# Patient Record
Sex: Female | Born: 1945
Health system: Southern US, Community
[De-identification: ages and names within clinical notes are randomized; demographics above are authoritative.]

## PROBLEM LIST (undated history)

## (undated) DIAGNOSIS — K589 Irritable bowel syndrome without diarrhea: Secondary | ICD-10-CM

## (undated) DIAGNOSIS — F419 Anxiety disorder, unspecified: Secondary | ICD-10-CM

## (undated) DIAGNOSIS — E039 Hypothyroidism, unspecified: Secondary | ICD-10-CM

## (undated) DIAGNOSIS — N6019 Diffuse cystic mastopathy of unspecified breast: Secondary | ICD-10-CM

## (undated) DIAGNOSIS — E7800 Pure hypercholesterolemia, unspecified: Secondary | ICD-10-CM

## (undated) DIAGNOSIS — M1990 Unspecified osteoarthritis, unspecified site: Secondary | ICD-10-CM

## (undated) DIAGNOSIS — M810 Age-related osteoporosis without current pathological fracture: Secondary | ICD-10-CM

## (undated) DIAGNOSIS — K219 Gastro-esophageal reflux disease without esophagitis: Secondary | ICD-10-CM

## (undated) HISTORY — DX: Hypothyroidism, unspecified: E03.9

## (undated) HISTORY — DX: Gastro-esophageal reflux disease without esophagitis: K21.9

## (undated) HISTORY — PX: LESION EXCISION: SHX5167

## (undated) HISTORY — DX: Diffuse cystic mastopathy of unspecified breast: N60.19

## (undated) HISTORY — DX: Pure hypercholesterolemia, unspecified: E78.00

## (undated) HISTORY — PX: PARTIAL HYSTERECTOMY: SHX80

## (undated) HISTORY — DX: Unspecified osteoarthritis, unspecified site: M19.90

## (undated) HISTORY — DX: Irritable bowel syndrome, unspecified: K58.9

---

## 1999-04-19 ENCOUNTER — Other Ambulatory Visit: Admission: RE | Admit: 1999-04-19 | Discharge: 1999-04-19 | Payer: Self-pay | Admitting: *Deleted

## 2000-10-09 ENCOUNTER — Other Ambulatory Visit: Admission: RE | Admit: 2000-10-09 | Discharge: 2000-10-09 | Payer: Self-pay | Admitting: *Deleted

## 2007-09-03 ENCOUNTER — Encounter: Admission: RE | Admit: 2007-09-03 | Discharge: 2007-09-03 | Payer: Self-pay | Admitting: Unknown Physician Specialty

## 2011-03-15 ENCOUNTER — Other Ambulatory Visit: Payer: Self-pay | Admitting: Obstetrics and Gynecology

## 2011-03-27 ENCOUNTER — Encounter (HOSPITAL_COMMUNITY): Payer: Self-pay | Admitting: Pharmacist

## 2011-04-02 ENCOUNTER — Encounter (HOSPITAL_COMMUNITY)
Admission: RE | Admit: 2011-04-02 | Discharge: 2011-04-02 | Disposition: A | Discharge status: Home / Self Care | Payer: Medicare Other | Source: Ambulatory Visit | Attending: Obstetrics and Gynecology | Admitting: Obstetrics and Gynecology

## 2011-04-02 ENCOUNTER — Other Ambulatory Visit: Payer: Self-pay

## 2011-04-02 ENCOUNTER — Encounter (HOSPITAL_COMMUNITY): Payer: Self-pay

## 2011-04-02 HISTORY — DX: Anxiety disorder, unspecified: F41.9

## 2011-04-02 LAB — CBC
MCH: 27.5 pg (ref 26.0–34.0)
MCHC: 33.2 g/dL (ref 30.0–36.0)
Platelets: 215 10*3/uL (ref 150–400)
RBC: 4.88 MIL/uL (ref 3.87–5.11)

## 2011-04-02 NOTE — Patient Instructions (Addendum)
20 Katie Dickerson  04/02/2011   Your procedure is scheduled on:  04/04/11  Enter through the Main Entrance of [facility] at 8 AM.  Pick up the phone at the desk and dial [phone].   Call this number if you have problems the morning of surgery: [phone]   Remember:   Do not eat food:After Midnight.  Do not drink clear liquids: After Midnight.  Take these medicines the morning of surgery with A SIP OF WATER: NA   Do not wear jewelry, make-up or nail polish.  Do not wear lotions, powders, or perfumes. You may wear deodorant.  Do not shave 48 hours prior to surgery.  Do not bring valuables to the hospital.  Contacts, dentures or bridgework may not be worn into surgery.  Leave suitcase in the car. After surgery it may be brought to your room.  For patients admitted to the hospital, checkout time is 11:00 AM the day of discharge.   Patients discharged the day of surgery will not be allowed to drive home.  Name and phone number of your driver: NA  Special Instructions: CHG Shower Use Special Wash: 1/2 bottle night before surgery and 1/2 bottle morning of surgery.   Please read over the following fact sheets that you were given: MRSA Information

## 2011-04-03 NOTE — H&P (Signed)
Subjective:    Patient is a 66 y.o. female scheduled for anterior and posterior colporrhaphy. She presented in October 2012 complaining of a large ball protruding through her vagina. On examination at that time she was noted to have a grade 3 cystocele which was the mass she has noted. Because of the symptoms of pelvic pressure and pain this large pelvic floor defected was causing she expressed a desire to undergo surgical repair of these defects. She is now being brought to the hospital to undergo anterior and posterior colporrhaphy. The patient had previously had a hysterectomy. She does note pelvic pressure but denies symptoms of stress incontinence. Pap smear of 03/01/2010 was negative except for atrophic changes.  Review of Systems Cardiovascular: negative  GI no nausea, vomiting or diarrhea GU: there is urgency but no dysuria, frequency or hematuria.. Gyn: No vaginal bleeding discharge or itch   Objective:    There were no vitals taken for this visit.  General:   alert, cooperative and appears stated age  Skin:   normal and no rash or abnormalities  HEENT:  PERRLA  Lungs:   clear to auscultation bilaterally  Heart:   regular rate and rhythm, S1, S2 normal, no murmur, click, rub or gallop  Breasts:   self-exam is taught and encouraged  Abdomen:  soft, non-tender; bowel sounds normal; no masses,  no organomegaly  Pelvis:  External genitalia: normal general appearance BUS: within normal limits Vagina: there is a grade 3 cystocele and 2nd degree rectocele Cervix and uterus are absent Adnexa reveal no masses                                 Assessment:    Symptomatic cystocele, rectocele S/P hysterectomy.  Plan:    Counseling: Procedure, risks, reasons, benefits and complications (including injury to bowel, bladder, major blood vessel, ureter, bleeding, possibility of transfusion, infection, or fistula formation) reviewed in detail. Consent signed. Preop testing  ordered. Instructions reviewed, including NPO after midnight. Plan is to proceed with and anterior and posterior colporrhaphy.

## 2011-04-04 ENCOUNTER — Encounter (HOSPITAL_COMMUNITY): Payer: Self-pay | Admitting: Anesthesiology

## 2011-04-04 ENCOUNTER — Ambulatory Visit (HOSPITAL_COMMUNITY)
Admission: RE | Admit: 2011-04-04 | Discharge: 2011-04-05 | Disposition: A | Discharge status: Home / Self Care | Payer: Medicare Other | Source: Ambulatory Visit | Attending: Obstetrics and Gynecology | Admitting: Obstetrics and Gynecology

## 2011-04-04 ENCOUNTER — Ambulatory Visit (HOSPITAL_COMMUNITY): Payer: Medicare Other | Admitting: Anesthesiology

## 2011-04-04 ENCOUNTER — Encounter (HOSPITAL_COMMUNITY)
Admission: RE | Disposition: A | Discharge status: Home / Self Care | Payer: Self-pay | Source: Ambulatory Visit | Attending: Obstetrics and Gynecology

## 2011-04-04 DIAGNOSIS — Z01818 Encounter for other preprocedural examination: Secondary | ICD-10-CM | POA: Insufficient documentation

## 2011-04-04 DIAGNOSIS — N993 Prolapse of vaginal vault after hysterectomy: Secondary | ICD-10-CM | POA: Insufficient documentation

## 2011-04-04 DIAGNOSIS — Z01812 Encounter for preprocedural laboratory examination: Secondary | ICD-10-CM | POA: Insufficient documentation

## 2011-04-04 HISTORY — PX: ANTERIOR AND POSTERIOR REPAIR: SHX5121

## 2011-04-04 LAB — COMPREHENSIVE METABOLIC PANEL
AST: 17 U/L (ref 0–37)
Albumin: 3.9 g/dL (ref 3.5–5.2)
Calcium: 9.9 mg/dL (ref 8.4–10.5)
Creatinine, Ser: 0.63 mg/dL (ref 0.50–1.10)
Sodium: 137 mEq/L (ref 135–145)

## 2011-04-04 LAB — APTT: aPTT: 28 seconds (ref 24–37)

## 2011-04-04 LAB — PROTIME-INR: INR: 0.93 (ref 0.00–1.49)

## 2011-04-04 SURGERY — ANTERIOR (CYSTOCELE) AND POSTERIOR REPAIR (RECTOCELE)
Anesthesia: General | Site: Vagina | Wound class: Clean Contaminated

## 2011-04-04 MED ORDER — LIDOCAINE-EPINEPHRINE 1 %-1:100000 IJ SOLN
INTRAMUSCULAR | Status: DC | PRN
  Administered 2011-04-04: 6 mL

## 2011-04-04 MED ORDER — DIPHENHYDRAMINE HCL 12.5 MG/5ML PO ELIX
12.5000 mg | ORAL_SOLUTION | Freq: Four times a day (QID) | ORAL | Status: DC | PRN
  Filled 2011-04-04: qty 5

## 2011-04-04 MED ORDER — FENTANYL CITRATE 0.05 MG/ML IJ SOLN
INTRAMUSCULAR | Status: AC
  Filled 2011-04-04: qty 2

## 2011-04-04 MED ORDER — ONDANSETRON HCL 4 MG/2ML IJ SOLN: 4.0000 mg | Freq: Four times a day (QID) | INTRAMUSCULAR | Status: DC | PRN

## 2011-04-04 MED ORDER — PROMETHAZINE HCL 25 MG/ML IJ SOLN: 12.5000 mg | INTRAMUSCULAR | Status: DC | PRN

## 2011-04-04 MED ORDER — OXYCODONE-ACETAMINOPHEN 5-325 MG PO TABS: 1.0000 | ORAL_TABLET | ORAL | Status: DC | PRN

## 2011-04-04 MED ORDER — PROPOFOL 10 MG/ML IV EMUL
INTRAVENOUS | Status: AC
  Filled 2011-04-04: qty 20

## 2011-04-04 MED ORDER — GLYCOPYRROLATE 0.2 MG/ML IJ SOLN
INTRAMUSCULAR | Status: AC
  Filled 2011-04-04: qty 1

## 2011-04-04 MED ORDER — HYDROMORPHONE HCL PF 1 MG/ML IJ SOLN
INTRAMUSCULAR | Status: AC
  Filled 2011-04-04: qty 1

## 2011-04-04 MED ORDER — HYDROMORPHONE HCL PF 1 MG/ML IJ SOLN
0.2500 mg | INTRAMUSCULAR | Status: DC | PRN
  Administered 2011-04-04 (×2): 0.5 mg via INTRAVENOUS

## 2011-04-04 MED ORDER — SODIUM CHLORIDE 0.9 % IJ SOLN: 9.0000 mL | INTRAMUSCULAR | Status: DC | PRN

## 2011-04-04 MED ORDER — FENTANYL CITRATE 0.05 MG/ML IJ SOLN
INTRAMUSCULAR | Status: DC | PRN
  Administered 2011-04-04: 50 ug via INTRAVENOUS
  Administered 2011-04-04 (×2): 25 ug via INTRAVENOUS

## 2011-04-04 MED ORDER — CEFAZOLIN SODIUM 1-5 GM-% IV SOLN
INTRAVENOUS | Status: AC
  Administered 2011-04-04: 1 g via INTRAVENOUS
  Filled 2011-04-04: qty 50

## 2011-04-04 MED ORDER — MIDAZOLAM HCL 2 MG/2ML IJ SOLN
INTRAMUSCULAR | Status: AC
  Filled 2011-04-04: qty 2

## 2011-04-04 MED ORDER — LACTATED RINGERS IV SOLN
INTRAVENOUS | Status: DC
  Administered 2011-04-04: 09:00:00 via INTRAVENOUS

## 2011-04-04 MED ORDER — MIDAZOLAM HCL 5 MG/5ML IJ SOLN
INTRAMUSCULAR | Status: DC | PRN
  Administered 2011-04-04: 1 mg via INTRAVENOUS

## 2011-04-04 MED ORDER — PROPOFOL 10 MG/ML IV EMUL
INTRAVENOUS | Status: DC | PRN
  Administered 2011-04-04: 150 mg via INTRAVENOUS

## 2011-04-04 MED ORDER — KETOROLAC TROMETHAMINE 30 MG/ML IJ SOLN
INTRAMUSCULAR | Status: DC | PRN
  Administered 2011-04-04: 30 mg via INTRAVENOUS

## 2011-04-04 MED ORDER — LIDOCAINE HCL (CARDIAC) 20 MG/ML IV SOLN
INTRAVENOUS | Status: AC
  Filled 2011-04-04: qty 5

## 2011-04-04 MED ORDER — MORPHINE SULFATE (PF) 1 MG/ML IV SOLN
INTRAVENOUS | Status: DC
  Administered 2011-04-04: 13:00:00 via INTRAVENOUS
  Administered 2011-04-04: 3 mg via INTRAVENOUS
  Administered 2011-04-04: 1 mg via INTRAVENOUS
  Administered 2011-04-04: 3 mg via INTRAVENOUS
  Filled 2011-04-04: qty 25

## 2011-04-04 MED ORDER — ONDANSETRON HCL 4 MG/2ML IJ SOLN
INTRAMUSCULAR | Status: AC
  Filled 2011-04-04: qty 2

## 2011-04-04 MED ORDER — MEPERIDINE HCL 25 MG/ML IJ SOLN: 6.2500 mg | INTRAMUSCULAR | Status: DC | PRN

## 2011-04-04 MED ORDER — MENTHOL 3 MG MT LOZG: 1.0000 | LOZENGE | OROMUCOSAL | Status: DC | PRN

## 2011-04-04 MED ORDER — LIDOCAINE HCL (CARDIAC) 20 MG/ML IV SOLN
INTRAVENOUS | Status: DC | PRN
  Administered 2011-04-04: 50 mg via INTRAVENOUS

## 2011-04-04 MED ORDER — ONDANSETRON HCL 4 MG/2ML IJ SOLN
INTRAMUSCULAR | Status: DC | PRN
  Administered 2011-04-04: 4 mg via INTRAVENOUS

## 2011-04-04 MED ORDER — KETOROLAC TROMETHAMINE 30 MG/ML IJ SOLN
INTRAMUSCULAR | Status: AC
  Filled 2011-04-04: qty 1

## 2011-04-04 MED ORDER — GLYCOPYRROLATE 0.2 MG/ML IJ SOLN
INTRAMUSCULAR | Status: DC | PRN
  Administered 2011-04-04: 0.2 mg via INTRAVENOUS

## 2011-04-04 MED ORDER — DIPHENHYDRAMINE HCL 50 MG/ML IJ SOLN: 12.5000 mg | Freq: Four times a day (QID) | INTRAMUSCULAR | Status: DC | PRN

## 2011-04-04 MED ORDER — PROMETHAZINE HCL 25 MG/ML IJ SOLN: 6.2500 mg | INTRAMUSCULAR | Status: DC | PRN

## 2011-04-04 MED ORDER — KETOROLAC TROMETHAMINE 30 MG/ML IJ SOLN: 15.0000 mg | Freq: Once | INTRAMUSCULAR | Status: DC | PRN

## 2011-04-04 MED ORDER — LACTATED RINGERS IV SOLN
INTRAVENOUS | Status: DC
  Administered 2011-04-04 (×2): via INTRAVENOUS

## 2011-04-04 MED ORDER — NALOXONE HCL 0.4 MG/ML IJ SOLN: 0.4000 mg | INTRAMUSCULAR | Status: DC | PRN

## 2011-04-04 MED ORDER — IBUPROFEN 600 MG PO TABS
600.0000 mg | ORAL_TABLET | Freq: Four times a day (QID) | ORAL | Status: DC | PRN
  Administered 2011-04-04: 600 mg via ORAL
  Filled 2011-04-04: qty 1

## 2011-04-04 SURGICAL SUPPLY — 24 items
BLADE SURG 15 STRL LF C SS BP (BLADE) ×1 IMPLANT
BLADE SURG 15 STRL SS (BLADE) ×1
CANISTER SUCTION 2500CC (MISCELLANEOUS) ×2 IMPLANT
CATH BONANNO SUPRAPUBIC 14G (CATHETERS) IMPLANT
CLOTH BEACON ORANGE TIMEOUT ST (SAFETY) ×2 IMPLANT
DECANTER SPIKE VIAL GLASS SM (MISCELLANEOUS) ×2 IMPLANT
GAUZE PACKING IODOFORM 1 (PACKING) ×2 IMPLANT
GLOVE BIO SURGEON STRL SZ7.5 (GLOVE) ×4 IMPLANT
GLOVE BIOGEL PI IND STRL 6 (GLOVE) ×1 IMPLANT
GLOVE BIOGEL PI INDICATOR 6 (GLOVE) ×1
GOWN PREVENTION PLUS LG XLONG (DISPOSABLE) ×6 IMPLANT
GOWN PREVENTION PLUS XXLARGE (GOWN DISPOSABLE) ×2 IMPLANT
NEEDLE HYPO 22GX1.5 SAFETY (NEEDLE) ×2 IMPLANT
NS IRRIG 1000ML POUR BTL (IV SOLUTION) ×2 IMPLANT
PACK VAGINAL WOMENS (CUSTOM PROCEDURE TRAY) ×2 IMPLANT
SUT VIC AB 0 CT1 18XCR BRD8 (SUTURE) IMPLANT
SUT VIC AB 0 CT1 8-18 (SUTURE)
SUT VIC AB 2-0 CT1 27 (SUTURE) ×4
SUT VIC AB 2-0 CT1 TAPERPNT 27 (SUTURE) ×4 IMPLANT
SUT VIC AB 2-0 SH 27 (SUTURE)
SUT VIC AB 2-0 SH 27XBRD (SUTURE) IMPLANT
TOWEL OR 17X24 6PK STRL BLUE (TOWEL DISPOSABLE) ×4 IMPLANT
TRAY FOLEY CATH 14FR (SET/KITS/TRAYS/PACK) ×2 IMPLANT
WATER STERILE IRR 1000ML POUR (IV SOLUTION) ×2 IMPLANT

## 2011-04-04 NOTE — Anesthesia Preprocedure Evaluation (Signed)
Anesthesia Evaluation  Patient identified by MRN, date of birth, ID band Patient awake    Reviewed: Allergy & Precautions, H&P , NPO status , Patient's Chart, lab work & pertinent test results  Airway Mallampati: I TM Distance: >3 FB Neck ROM: full    Dental No notable dental hx. (+) Teeth Intact   Pulmonary neg pulmonary ROS,    Pulmonary exam normal       Cardiovascular neg cardio ROS     Neuro/Psych PSYCHIATRIC DISORDERS Anxiety Negative Neurological ROS     GI/Hepatic negative GI ROS, Neg liver ROS,   Endo/Other  Negative Endocrine ROS  Renal/GU negative Renal ROS  Genitourinary negative   Musculoskeletal negative musculoskeletal ROS (+)   Abdominal Normal abdominal exam  (+)   Peds negative pediatric ROS (+)  Hematology negative hematology ROS (+)   Anesthesia Other Findings   Reproductive/Obstetrics negative OB ROS                           Anesthesia Physical Anesthesia Plan  ASA: II  Anesthesia Plan: General   Post-op Pain Management:    Induction: Intravenous  Airway Management Planned: LMA  Additional Equipment:   Intra-op Plan:   Post-operative Plan:   Informed Consent: I have reviewed the patients History and Physical, chart, labs and discussed the procedure including the risks, benefits and alternatives for the proposed anesthesia with the patient or authorized representative who has indicated his/her understanding and acceptance.     Plan Discussed with: CRNA  Anesthesia Plan Comments:         Anesthesia Quick Evaluation

## 2011-04-04 NOTE — Transfer of Care (Signed)
Immediate Anesthesia Transfer of Care Note  Patient: Katie Dickerson  Procedure(s) Performed: Procedure(s) (LRB): ANTERIOR (CYSTOCELE) AND POSTERIOR REPAIR (RECTOCELE) (N/A)  Patient Location: PACU  Anesthesia Type: General  Level of Consciousness: awake, alert  and sedated  Airway & Oxygen Therapy: Patient Spontanous Breathing and Patient connected to nasal cannula oxygen  Post-op Assessment: Report given to PACU RN and Post -op Vital signs reviewed and stable  Post vital signs: Reviewed and stable  Complications: No apparent anesthesia complications

## 2011-04-04 NOTE — Anesthesia Postprocedure Evaluation (Signed)
Anesthesia Post Note  Patient: Katie Dickerson  Procedure(s) Performed: Procedure(s) (LRB): ANTERIOR (CYSTOCELE) AND POSTERIOR REPAIR (RECTOCELE) (N/A)  Anesthesia type: General  Patient location: PACU  Post pain: Pain level controlled  Post assessment: Post-op Vital signs reviewed  Last Vitals:  Filed Vitals:   04/04/11 1100  BP: 120/67  Pulse: 60  Temp:   Resp: 21    Post vital signs: Reviewed  Level of consciousness: sedated  Complications: No apparent anesthesia complicationsfj

## 2011-04-04 NOTE — Brief Op Note (Signed)
04/04/2011  10:30 AM  PATIENT:  Katie Dickerson  66 y.o. female  PRE-OPERATIVE DIAGNOSIS:  CYSTOCELE RECTOCELE  POST-OPERATIVE DIAGNOSIS:  CYSTOCELE RECTOCELE  PROCEDURE:  Procedure(s) (LRB): ANTERIOR (CYSTOCELE) AND POSTERIOR REPAIR (RECTOCELE) (N/A)  SURGEON:  Surgeon(s) and Role:    * Miguel Aschoff, MD - Primary    * W Lodema Hong, MD - Assisting  ANESTHESIA:   general  EBL:  Total I/O In: 1200 [I.V.:1200] Out: 120 [Urine:100; Blood:20]  BLOOD ADMINISTERED:none  DRAINS: Vaginal pack in place   LOCAL MEDICATIONS USED:  XYLOCAINE   SPECIMEN:  No Specimen  DISPOSITION OF SPECIMEN:  N/A  COUNTS:  YES   DICTATION: .Other Dictation: Dictation Number [id number]  PLAN OF CARE: Admit for overnight observation  PATIENT DISPOSITION:  PACU - hemodynamically stable.   Delay start of Pharmacological VTE agent (>24hrs) due to surgical blood loss or risk of bleeding: Has PAS hose

## 2011-04-04 NOTE — Progress Notes (Signed)
UR chart review completed.  

## 2011-04-05 NOTE — Discharge Instructions (Addendum)
Set up follow up visit for 4 weeks  Take pain medication as needed  Nothing per vagina for 4 weeks  Call for any problems such as fever pain or heavy bleedng  Resume prior medications

## 2011-04-05 NOTE — Op Note (Signed)
NAMEMELESSIA, KAUS              ACCOUNT NO.:  [id number]  MEDICAL RECORD NO.:  [id number]  LOCATION:  9304                          FACILITY:  [facility]  PHYSICIAN:  Miguel Aschoff, M.D.       DATE OF BIRTH:  04-21-1945  DATE OF PROCEDURE:  04/04/2011 DATE OF DISCHARGE:                              OPERATIVE REPORT   PREOPERATIVE DIAGNOSIS:  Large symptomatic cystocele, rectocele.  PROCEDURE:  Anterior and posterior colporrhaphy.  SURGEON:  Miguel Aschoff, M.D.  ASSISTANT:  Luvenia Redden, M.D.  ANESTHESIA:  General.  COMPLICATIONS:  None.  JUSTIFICATION:  The patient is a 66 year old, white female, who presented to the office, reporting a very large bulge protruding through the vagina.  On examination, the patient had a grade 3 cystocele and a first and second-degree rectocele.  The major portion of the patient's defect was anterior vaginal wall with large cystocele.  Because of symptoms associated with this, the patient requested that the procedure be carried out to correct the pelvic floor defects, and she was given informed consent for anterior-posterior colporrhaphy.  The risks and benefits of the procedure were discussed with the patient.  Informed consent has been obtained.  PROCEDURE IN DETAIL:  The patient was taken to the operating room, placed in supine position.  General anesthesia was administered without difficulty.  She was then placed in the dorsal lithotomy position and prepped and draped in usual sterile fashion.  Foley catheter was inserted.  Following this, the anterior vaginal wall was injected with 1% Xylocaine with epinephrine for hemostasis.  Total of 6 mL were used. At this point, the midline of the anterior vaginal wall was elevated with Allis clamps.  The large cystocele identified and then the vaginal mucosa was incised in the midline from within 2 cm of the urethral orifice to the apex of the vaginal cuff.  After this was done, the cystocele and  paravesical fascia were removed from the overlying vaginal mucosa.  This was done without difficulty and with care to avoid any injury to the bladder.  Once this was done, and the large cystocele identified, the cystocele was reduced using several purse-string sutures of 2-0 Vicryl.  After the cystocele was reduced, the paravesical fascia was reapproximated in the midline using interrupted 2-0 Vicryl sutures. At this point, the excess vaginal mucosa was trimmed and then the mucosa was reapproximated using running interlocking 2-0 Vicryl suture, incorporating the paravesical fascia to close the dead space.  After this was done and completed the repair of the anterior vaginal wall, attention was directed to the posterior vaginal wall.  The lips of perineal skin was elevated.  Allis clamps were then placed in the midline of the posterior vaginal wall for about 5 cm, posterior vaginal wall was then injected with 1% Xylocaine with epinephrine for hemostasis and then the dissection was carried out in the midline.  Once this was done, the pararectal fascia and rectocele were dissected free from the overlying vaginal mucosa.  At this point, the rectocele was clearly outlined and then the rectocele was reduced using 2 purse-string sutures of 2-0 Vicryl suture and then reinforced using interrupted 2-0 Vicryl sutures using the pararectal  fascia.  The excess vaginal mucosa was then trimmed and reapproximated in the midline using running interlocking 2-0 Vicryl suture again closing the dead space.  This was continued until the perineal body was reached.  Two Crown sutures of 0 Vicryl were used to reconstitute the perineal body.  Once this was done, the perineum was closed using a subcutaneous suture of 2-0 Vicryl followed by a subcuticular suture of 2-0 Vicryl.  At this point, the procedure was completed and Iodoform pack was placed in the vagina for compression of hemostasis.  Estimated blood loss was  minimal.  The patient reversed from the anesthetic and taken to recovery room in satisfactory condition.  The estimated blood loss again was minimal.  Plan for the patient to be observed overnight and to be discharged home on April 05, 2011, in stable and satisfactory condition.     Miguel Aschoff, M.D.     AR/MEDQ  D:  04/04/2011  T:  04/05/2011  Job:  [id number]

## 2011-04-05 NOTE — Anesthesia Postprocedure Evaluation (Signed)
Anesthesia Post Note  Patient: Katie Dickerson  Procedure(s) Performed: Procedure(s) (LRB): ANTERIOR (CYSTOCELE) AND POSTERIOR REPAIR (RECTOCELE) (N/A)  Anesthesia type: General  Patient location: Mother/Baby  Post pain: Pain level controlled  Post assessment: Post-op Vital signs reviewed  Last Vitals:  Filed Vitals:   04/05/11 0545  BP: 118/70  Pulse: 78  Temp: 36.8 C  Resp: 18    Post vital signs: Reviewed  Level of consciousness: awake and alert   Complications: No apparent anesthesia complications

## 2011-04-05 NOTE — Progress Notes (Signed)
S: Doing well this AM. Has used minimal pain medication. Has not voided yet.  O: Afebrile V/S stable      Abdomen is soft non tender      Perineum is dry  A: Stable s/p A&P repair.  P: Discharge home is she can not void will send home with leg bag      RTC in 4 weeks      Meds Tylox one ever 3 to 4 hours as needed for pain      Nothing per vagina       Regular diet      Resume prior meds      To call for fever, severe pain or heavy bleeding.

## 2011-04-05 NOTE — Addendum Note (Signed)
Addendum  created 04/05/11 0746 by Doreene Burke, CRNA   Modules edited:Notes Section

## 2011-04-08 ENCOUNTER — Encounter (HOSPITAL_COMMUNITY): Payer: Self-pay | Admitting: Obstetrics and Gynecology

## 2011-04-09 NOTE — Discharge Summary (Signed)
NAMEPAULETTE, ROCKFORD              ACCOUNT NO.:  [id number]  MEDICAL RECORD NO.:  [id number]  LOCATION:  9304                          FACILITY:  [facility]  PHYSICIAN:  Miguel Aschoff, M.D.       DATE OF BIRTH:  [birth year]  DATE OF ADMISSION:  04/04/2011 DATE OF DISCHARGE:  04/05/2011                              DISCHARGE SUMMARY   ADMISSION DIAGNOSIS:  Symptomatic cystocele and rectocele.  OPERATIONS AND PROCEDURES:  Anterior and posterior colporrhaphy, general anesthesia.  BRIEF HISTORY:  The patient is a 66 year old white female, status post prior hysterectomy, who reported that she had noticed a large bulge protruding through the vagina.  On examination, this large bulge hole was a second to third-degree cystocele with mild urethrocele.  The patient denied any symptoms of urinary stress incontinence.  In addition, she had a first to second-degree rectocele similarly noted on this examination.  Because of symptomatology associated with these pelvic floor defects, the patient has requested that a procedure to be carried out in effort to repair these pelvic floor defects, and she was brought to the hospital to undergo anterior and posterior colporrhaphy after being counseled as to risks and benefits of this procedure.  HOSPITAL COURSE:  Preoperative studies were obtained.  This revealed admission hemoglobin of 13.4, hematocrit 40.4, white count was 7400. Chemistry profile was essentially within normal limits except for a glucose of 108.  PT and PTT were within normal limits.  HOSPITAL COURSE:  Under general anesthesia on April 04, 2011, the patient underwent an anterior and posterior colporrhaphy without difficulty with repair of the large cystocele and smaller rectocele. The patient's postoperative course was essentially uncomplicated.  By the morning of the first postoperative day, she was tolerating regular diet, ambulating well.  She had a vaginal pack removed with only  small amount of drainage and was able to void spontaneously without significant residual urine.  Because of this, it was felt that she was stable enough to be discharged home.  Medications for home included Tylox 1 every 3-4 hours as needed for pain, requested to do no heavy lifting, place nothing in the vagina, to call for any problems such as fever, pain, or heavy bleeding.  She will be seen back in 4 weeks for followup examination.  She was sent home on a regular diet in satisfactory condition.     Miguel Aschoff, M.D.     AR/MEDQ  D:  04/08/2011  T:  04/09/2011  Job:  [id number]

## 2015-02-22 DIAGNOSIS — G894 Chronic pain syndrome: Secondary | ICD-10-CM | POA: Diagnosis not present

## 2015-02-22 DIAGNOSIS — M5136 Other intervertebral disc degeneration, lumbar region: Secondary | ICD-10-CM | POA: Diagnosis not present

## 2015-02-22 DIAGNOSIS — M4854XG Collapsed vertebra, not elsewhere classified, thoracic region, subsequent encounter for fracture with delayed healing: Secondary | ICD-10-CM | POA: Diagnosis not present

## 2015-02-22 DIAGNOSIS — M546 Pain in thoracic spine: Secondary | ICD-10-CM | POA: Diagnosis not present

## 2015-03-08 DIAGNOSIS — M1990 Unspecified osteoarthritis, unspecified site: Secondary | ICD-10-CM | POA: Diagnosis not present

## 2015-03-08 DIAGNOSIS — J45909 Unspecified asthma, uncomplicated: Secondary | ICD-10-CM | POA: Diagnosis not present

## 2015-03-08 DIAGNOSIS — R5383 Other fatigue: Secondary | ICD-10-CM | POA: Diagnosis not present

## 2015-03-08 DIAGNOSIS — F419 Anxiety disorder, unspecified: Secondary | ICD-10-CM | POA: Diagnosis not present

## 2015-03-08 DIAGNOSIS — R9431 Abnormal electrocardiogram [ECG] [EKG]: Secondary | ICD-10-CM | POA: Diagnosis not present

## 2015-03-23 ENCOUNTER — Institutional Professional Consult (permissible substitution): Payer: Self-pay | Admitting: Internal Medicine

## 2015-03-28 DIAGNOSIS — H524 Presbyopia: Secondary | ICD-10-CM | POA: Diagnosis not present

## 2015-03-28 DIAGNOSIS — H2513 Age-related nuclear cataract, bilateral: Secondary | ICD-10-CM | POA: Diagnosis not present

## 2015-03-31 ENCOUNTER — Encounter: Payer: Self-pay | Admitting: Internal Medicine

## 2015-03-31 ENCOUNTER — Ambulatory Visit (INDEPENDENT_AMBULATORY_CARE_PROVIDER_SITE_OTHER): Payer: PPO | Admitting: Internal Medicine

## 2015-03-31 VITALS — BP 134/80 | HR 72 | Ht 62.5 in | Wt 149.0 lb

## 2015-03-31 DIAGNOSIS — R0600 Dyspnea, unspecified: Secondary | ICD-10-CM | POA: Diagnosis not present

## 2015-03-31 DIAGNOSIS — R938 Abnormal findings on diagnostic imaging of other specified body structures: Secondary | ICD-10-CM

## 2015-03-31 DIAGNOSIS — R9389 Abnormal findings on diagnostic imaging of other specified body structures: Secondary | ICD-10-CM

## 2015-03-31 MED ORDER — ALBUTEROL SULFATE HFA 108 (90 BASE) MCG/ACT IN AERS: INHALATION_SPRAY | RESPIRATORY_TRACT | Status: DC

## 2015-03-31 NOTE — Patient Instructions (Addendum)
Either take BREO perfectly regularly or leave it off completely   Only use your albuterol (proair) as a rescue medication to be used if you can't catch your breath by resting or doing a relaxed purse lip breathing pattern.  - The less you use it, the better it will work when you need it. - Ok to use up to 2 puffs  every 4 hours if you must but call for immediate appointment if use goes up over your usual need - Don't leave home without it !!  (think of it like the spare tire for your car)    If you are satisfied with your treatment plan,  let your doctor know and he/she can either refill your medications or you can return here when your prescription runs out.     If in any way you are not 100% satisfied,  please tell us.  If 100% better, tell your friends!  Pulmonary follow up is as needed

## 2015-03-31 NOTE — Progress Notes (Signed)
Subjective:     Patient ID: Katie Dickerson, female   DOB: [birth year],    MRN: [id number]  HPI  59 yowf never smoker noted onset of breathing walking up steps carrying clothes x 2015/16 resolved p started BREO per Chodri referred to pulmonary clinic by Dr Feliciana Rossetti.   03/31/2015 1st [facility] office visit/ Keyoni Lapinski   Chief Complaint  Patient presents with  . Pulmonary Consult    Referred by Dr. Feliciana Rossetti for eval of Asthma. Pt states her breathing is doing well today. She states that she gets SOB "maybe with exertion".    .Not limited by breathing from desired activities   no problem sleeping and no longer attempting to walk up steps which was the only activity she says made her sob. Never using saba  No obvious day to day or daytime variability or assoc chronic cough or cp or chest tightness, subjective wheeze or overt sinus or hb symptoms. No unusual exp hx or h/o childhood pna/ asthma or knowledge of premature birth.  Sleeping ok without nocturnal  or early am exacerbation  of respiratory  c/o's or need for noct saba. Also denies any obvious fluctuation of symptoms with weather or environmental changes or other aggravating or alleviating factors except as outlined above   Current Medications, Allergies, Complete Past Medical History, Past Surgical History, Family History, and Social History were reviewed in [employer] record.  ROS  The following are not active complaints unless bolded sore throat, dysphagia, dental problems, itching, sneezing,  nasal congestion or excess/ purulent secretions, ear ache,   fever, chills, sweats, unintended wt loss, classically pleuritic or exertional cp, hemoptysis,  orthopnea pnd or leg swelling, presyncope, palpitations, abdominal pain, anorexia, nausea, vomiting, diarrhea  or change in bowel or bladder habits, change in stools or urine, dysuria,hematuria,  rash, arthralgias, visual complaints, headache, numbness, weakness or  ataxia or problems with walking or coordination,  change in mood/affect or memory.           Review of Systems     Objective:   Physical Exam Amb wf nad very evasive with questions related to symptoms   Wt Readings from Last 3 Encounters:  03/31/15 149 lb (67.586 kg)  04/05/11 145 lb (65.772 kg)  04/02/11 145 lb (65.772 kg)    Vital signs reviewed  HEENT: nl dentition, turbinates, and oropharynx. Nl external ear canals without cough reflex   NECK :  without JVD/Nodes/TM/ nl carotid upstrokes bilaterally   LUNGS: no acc muscle use,  Nl contour chest which is clear to A and P bilaterally without cough on insp or exp maneuvers   CV:  RRR  no s3 or murmur or increase in P2, no edema   ABD:  soft and nontender with nl inspiratory excursion in the supine position. No bruits or organomegaly, bowel sounds nl  MS:  Nl gait/ ext warm without deformities, calf tenderness, cyanosis or clubbing No obvious joint restrictions   SKIN: warm and dry without lesions    NEURO:  alert, approp, nl sensorium with  no motor deficits     I personally reviewed images and agree with radiology impression as follows:  CTa Chest   01/19/15    No evidence of PE/ no infiltrate or effusion. perfissual lympmh node  On R > if pt high risk, repeat w/in one year      Assessment:

## 2015-04-01 ENCOUNTER — Encounter: Payer: Self-pay | Admitting: Internal Medicine

## 2015-04-01 DIAGNOSIS — R9389 Abnormal findings on diagnostic imaging of other specified body structures: Secondary | ICD-10-CM | POA: Insufficient documentation

## 2015-04-01 DIAGNOSIS — R0600 Dyspnea, unspecified: Secondary | ICD-10-CM | POA: Insufficient documentation

## 2015-04-01 NOTE — Assessment & Plan Note (Signed)
See scan 01/19/15 c/w pre fissural Lymph node  In the setting of a never smoker with a typical appearance of a benign lymph node there is no recommendation for regular dedicated CT F/u  Discussed in detail all the  indications, usual  risks and alternatives  relative to the benefits with patient who agrees to proceed with conservative f/u as outlined  =  Repeat ct only if symptoms warrant

## 2015-04-01 NOTE — Assessment & Plan Note (Signed)
The lack of variability/ noct symptoms or assoc cough / wheeze by her hx do not sound like asthma to me and clearly this is not copd in this never smoker though I could not find any pfts in Dr Terrilee Croak notes  In this setting it's hard to commit her to longterm BREO and fine to leave it off and just use prn saba - if breathing worse or saba use over twice weekly then would rechallenge with BREO  I had an extended discussion with the patient reviewing all relevant studies completed to date and  lasting 35  minutes of a 60 minute visit    Each maintenance medication was reviewed in detail including most importantly the difference between maintenance and prns and under what circumstances the prns are to be triggered using an action plan format that is not reflected in the computer generated alphabetically organized AVS.    Please see instructions for details which were reviewed in writing and the patient given a copy highlighting the part that I personally wrote and discussed at today's ov.   Regular pulmonary f/u not needed for this problem

## 2015-05-01 DIAGNOSIS — M810 Age-related osteoporosis without current pathological fracture: Secondary | ICD-10-CM | POA: Diagnosis not present

## 2015-05-01 DIAGNOSIS — K210 Gastro-esophageal reflux disease with esophagitis: Secondary | ICD-10-CM | POA: Diagnosis not present

## 2015-05-01 DIAGNOSIS — R5381 Other malaise: Secondary | ICD-10-CM | POA: Diagnosis not present

## 2015-05-01 DIAGNOSIS — E559 Vitamin D deficiency, unspecified: Secondary | ICD-10-CM | POA: Diagnosis not present

## 2015-05-01 DIAGNOSIS — M150 Primary generalized (osteo)arthritis: Secondary | ICD-10-CM | POA: Diagnosis not present

## 2015-05-01 DIAGNOSIS — F419 Anxiety disorder, unspecified: Secondary | ICD-10-CM | POA: Diagnosis not present

## 2015-05-01 DIAGNOSIS — N6019 Diffuse cystic mastopathy of unspecified breast: Secondary | ICD-10-CM | POA: Diagnosis not present

## 2015-05-01 DIAGNOSIS — Z0000 Encounter for general adult medical examination without abnormal findings: Secondary | ICD-10-CM | POA: Diagnosis not present

## 2015-05-01 DIAGNOSIS — R5383 Other fatigue: Secondary | ICD-10-CM | POA: Diagnosis not present

## 2015-05-01 DIAGNOSIS — E0590 Thyrotoxicosis, unspecified without thyrotoxic crisis or storm: Secondary | ICD-10-CM | POA: Diagnosis not present

## 2015-06-14 DIAGNOSIS — N2889 Other specified disorders of kidney and ureter: Secondary | ICD-10-CM | POA: Diagnosis not present

## 2015-06-14 DIAGNOSIS — Z87440 Personal history of urinary (tract) infections: Secondary | ICD-10-CM | POA: Diagnosis not present

## 2015-07-27 DIAGNOSIS — R5381 Other malaise: Secondary | ICD-10-CM | POA: Diagnosis not present

## 2015-07-27 DIAGNOSIS — M150 Primary generalized (osteo)arthritis: Secondary | ICD-10-CM | POA: Diagnosis not present

## 2015-07-27 DIAGNOSIS — R5383 Other fatigue: Secondary | ICD-10-CM | POA: Diagnosis not present

## 2015-07-27 DIAGNOSIS — F419 Anxiety disorder, unspecified: Secondary | ICD-10-CM | POA: Diagnosis not present

## 2015-07-27 DIAGNOSIS — F331 Major depressive disorder, recurrent, moderate: Secondary | ICD-10-CM | POA: Diagnosis not present

## 2015-07-27 DIAGNOSIS — F5101 Primary insomnia: Secondary | ICD-10-CM | POA: Diagnosis not present

## 2015-07-27 DIAGNOSIS — E559 Vitamin D deficiency, unspecified: Secondary | ICD-10-CM | POA: Diagnosis not present

## 2015-08-09 DIAGNOSIS — M150 Primary generalized (osteo)arthritis: Secondary | ICD-10-CM | POA: Diagnosis not present

## 2015-08-09 DIAGNOSIS — F331 Major depressive disorder, recurrent, moderate: Secondary | ICD-10-CM | POA: Diagnosis not present

## 2015-09-19 DIAGNOSIS — M8000XG Age-related osteoporosis with current pathological fracture, unspecified site, subsequent encounter for fracture with delayed healing: Secondary | ICD-10-CM | POA: Diagnosis not present

## 2015-09-19 DIAGNOSIS — F5101 Primary insomnia: Secondary | ICD-10-CM | POA: Diagnosis not present

## 2015-09-19 DIAGNOSIS — F419 Anxiety disorder, unspecified: Secondary | ICD-10-CM | POA: Diagnosis not present

## 2015-10-18 DIAGNOSIS — Z1231 Encounter for screening mammogram for malignant neoplasm of breast: Secondary | ICD-10-CM | POA: Diagnosis not present

## 2015-10-18 DIAGNOSIS — Z01419 Encounter for gynecological examination (general) (routine) without abnormal findings: Secondary | ICD-10-CM | POA: Diagnosis not present

## 2015-10-26 DIAGNOSIS — Z5181 Encounter for therapeutic drug level monitoring: Secondary | ICD-10-CM | POA: Diagnosis not present

## 2015-10-31 DIAGNOSIS — M5136 Other intervertebral disc degeneration, lumbar region: Secondary | ICD-10-CM | POA: Diagnosis not present

## 2015-11-16 DIAGNOSIS — M5136 Other intervertebral disc degeneration, lumbar region: Secondary | ICD-10-CM | POA: Diagnosis not present

## 2015-11-16 DIAGNOSIS — M546 Pain in thoracic spine: Secondary | ICD-10-CM | POA: Diagnosis not present

## 2015-11-16 DIAGNOSIS — M818 Other osteoporosis without current pathological fracture: Secondary | ICD-10-CM | POA: Diagnosis not present

## 2015-11-16 DIAGNOSIS — G894 Chronic pain syndrome: Secondary | ICD-10-CM | POA: Diagnosis not present

## 2015-11-21 DIAGNOSIS — Z23 Encounter for immunization: Secondary | ICD-10-CM | POA: Diagnosis not present

## 2016-01-19 DIAGNOSIS — M1812 Unilateral primary osteoarthritis of first carpometacarpal joint, left hand: Secondary | ICD-10-CM | POA: Diagnosis not present

## 2016-01-19 DIAGNOSIS — M1811 Unilateral primary osteoarthritis of first carpometacarpal joint, right hand: Secondary | ICD-10-CM | POA: Diagnosis not present

## 2016-01-19 DIAGNOSIS — M180 Bilateral primary osteoarthritis of first carpometacarpal joints: Secondary | ICD-10-CM | POA: Diagnosis not present

## 2016-04-22 DIAGNOSIS — M1811 Unilateral primary osteoarthritis of first carpometacarpal joint, right hand: Secondary | ICD-10-CM | POA: Diagnosis not present

## 2016-04-22 DIAGNOSIS — M1812 Unilateral primary osteoarthritis of first carpometacarpal joint, left hand: Secondary | ICD-10-CM | POA: Diagnosis not present

## 2016-06-07 DIAGNOSIS — G5601 Carpal tunnel syndrome, right upper limb: Secondary | ICD-10-CM | POA: Diagnosis not present

## 2016-06-07 DIAGNOSIS — M1812 Unilateral primary osteoarthritis of first carpometacarpal joint, left hand: Secondary | ICD-10-CM | POA: Diagnosis not present

## 2016-06-07 DIAGNOSIS — M1811 Unilateral primary osteoarthritis of first carpometacarpal joint, right hand: Secondary | ICD-10-CM | POA: Diagnosis not present

## 2016-06-25 DIAGNOSIS — M5136 Other intervertebral disc degeneration, lumbar region: Secondary | ICD-10-CM | POA: Diagnosis not present

## 2016-07-22 DIAGNOSIS — G5601 Carpal tunnel syndrome, right upper limb: Secondary | ICD-10-CM | POA: Diagnosis not present

## 2016-07-25 DIAGNOSIS — F5101 Primary insomnia: Secondary | ICD-10-CM | POA: Diagnosis not present

## 2016-07-25 DIAGNOSIS — F419 Anxiety disorder, unspecified: Secondary | ICD-10-CM | POA: Diagnosis not present

## 2016-07-25 DIAGNOSIS — M8000XG Age-related osteoporosis with current pathological fracture, unspecified site, subsequent encounter for fracture with delayed healing: Secondary | ICD-10-CM | POA: Diagnosis not present

## 2016-07-25 DIAGNOSIS — E559 Vitamin D deficiency, unspecified: Secondary | ICD-10-CM | POA: Diagnosis not present

## 2016-07-25 DIAGNOSIS — E782 Mixed hyperlipidemia: Secondary | ICD-10-CM | POA: Diagnosis not present

## 2016-07-25 DIAGNOSIS — R5383 Other fatigue: Secondary | ICD-10-CM | POA: Diagnosis not present

## 2016-07-25 DIAGNOSIS — R5381 Other malaise: Secondary | ICD-10-CM | POA: Diagnosis not present

## 2016-07-25 DIAGNOSIS — Z79899 Other long term (current) drug therapy: Secondary | ICD-10-CM | POA: Diagnosis not present

## 2016-07-25 DIAGNOSIS — F331 Major depressive disorder, recurrent, moderate: Secondary | ICD-10-CM | POA: Diagnosis not present

## 2016-07-25 DIAGNOSIS — Z5181 Encounter for therapeutic drug level monitoring: Secondary | ICD-10-CM | POA: Diagnosis not present

## 2016-07-25 DIAGNOSIS — K210 Gastro-esophageal reflux disease with esophagitis: Secondary | ICD-10-CM | POA: Diagnosis not present

## 2016-07-25 DIAGNOSIS — N6019 Diffuse cystic mastopathy of unspecified breast: Secondary | ICD-10-CM | POA: Diagnosis not present

## 2016-07-25 DIAGNOSIS — M150 Primary generalized (osteo)arthritis: Secondary | ICD-10-CM | POA: Diagnosis not present

## 2016-07-25 DIAGNOSIS — E0590 Thyrotoxicosis, unspecified without thyrotoxic crisis or storm: Secondary | ICD-10-CM | POA: Diagnosis not present

## 2016-08-09 DIAGNOSIS — R0789 Other chest pain: Secondary | ICD-10-CM | POA: Diagnosis not present

## 2016-08-09 DIAGNOSIS — K219 Gastro-esophageal reflux disease without esophagitis: Secondary | ICD-10-CM | POA: Diagnosis not present

## 2016-08-09 DIAGNOSIS — R079 Chest pain, unspecified: Secondary | ICD-10-CM | POA: Diagnosis not present

## 2016-08-09 DIAGNOSIS — Z79899 Other long term (current) drug therapy: Secondary | ICD-10-CM | POA: Diagnosis not present

## 2016-08-09 DIAGNOSIS — F419 Anxiety disorder, unspecified: Secondary | ICD-10-CM | POA: Diagnosis not present

## 2016-08-10 DIAGNOSIS — R079 Chest pain, unspecified: Secondary | ICD-10-CM | POA: Diagnosis not present

## 2016-08-10 DIAGNOSIS — F419 Anxiety disorder, unspecified: Secondary | ICD-10-CM | POA: Diagnosis not present

## 2016-08-10 DIAGNOSIS — K219 Gastro-esophageal reflux disease without esophagitis: Secondary | ICD-10-CM | POA: Diagnosis not present

## 2016-08-10 DIAGNOSIS — R0789 Other chest pain: Secondary | ICD-10-CM | POA: Diagnosis not present

## 2016-10-16 DIAGNOSIS — H02831 Dermatochalasis of right upper eyelid: Secondary | ICD-10-CM | POA: Diagnosis not present

## 2016-10-16 DIAGNOSIS — H2512 Age-related nuclear cataract, left eye: Secondary | ICD-10-CM | POA: Diagnosis not present

## 2016-10-16 DIAGNOSIS — H2511 Age-related nuclear cataract, right eye: Secondary | ICD-10-CM | POA: Diagnosis not present

## 2016-11-06 DIAGNOSIS — H2512 Age-related nuclear cataract, left eye: Secondary | ICD-10-CM | POA: Diagnosis not present

## 2016-11-19 DIAGNOSIS — Z23 Encounter for immunization: Secondary | ICD-10-CM | POA: Diagnosis not present

## 2016-11-19 DIAGNOSIS — K210 Gastro-esophageal reflux disease with esophagitis: Secondary | ICD-10-CM | POA: Diagnosis not present

## 2016-11-19 DIAGNOSIS — E0590 Thyrotoxicosis, unspecified without thyrotoxic crisis or storm: Secondary | ICD-10-CM | POA: Diagnosis not present

## 2016-11-19 DIAGNOSIS — M150 Primary generalized (osteo)arthritis: Secondary | ICD-10-CM | POA: Diagnosis not present

## 2016-11-19 DIAGNOSIS — M8000XG Age-related osteoporosis with current pathological fracture, unspecified site, subsequent encounter for fracture with delayed healing: Secondary | ICD-10-CM | POA: Diagnosis not present

## 2016-12-06 DIAGNOSIS — M1812 Unilateral primary osteoarthritis of first carpometacarpal joint, left hand: Secondary | ICD-10-CM | POA: Diagnosis not present

## 2016-12-06 DIAGNOSIS — M1811 Unilateral primary osteoarthritis of first carpometacarpal joint, right hand: Secondary | ICD-10-CM | POA: Diagnosis not present

## 2016-12-06 DIAGNOSIS — G5601 Carpal tunnel syndrome, right upper limb: Secondary | ICD-10-CM | POA: Diagnosis not present

## 2017-01-06 DIAGNOSIS — M79642 Pain in left hand: Secondary | ICD-10-CM | POA: Diagnosis not present

## 2017-01-06 DIAGNOSIS — M79641 Pain in right hand: Secondary | ICD-10-CM | POA: Diagnosis not present

## 2017-05-01 DIAGNOSIS — H25811 Combined forms of age-related cataract, right eye: Secondary | ICD-10-CM | POA: Diagnosis not present

## 2017-05-01 DIAGNOSIS — H2511 Age-related nuclear cataract, right eye: Secondary | ICD-10-CM | POA: Diagnosis not present

## 2017-05-01 DIAGNOSIS — H524 Presbyopia: Secondary | ICD-10-CM | POA: Diagnosis not present

## 2017-05-01 DIAGNOSIS — Z01818 Encounter for other preprocedural examination: Secondary | ICD-10-CM | POA: Diagnosis not present

## 2017-05-16 ENCOUNTER — Inpatient Hospital Stay (HOSPITAL_COMMUNITY): Payer: PPO

## 2017-05-16 ENCOUNTER — Encounter (HOSPITAL_COMMUNITY)
Admission: EM | Disposition: A | Discharge status: Skilled Nursing Facility | Payer: Self-pay | Source: Home / Self Care | Attending: Orthopedic Surgery

## 2017-05-16 ENCOUNTER — Encounter (HOSPITAL_COMMUNITY): Payer: Self-pay | Admitting: Internal Medicine

## 2017-05-16 ENCOUNTER — Inpatient Hospital Stay (HOSPITAL_COMMUNITY)
Admission: EM | Admit: 2017-05-16 | Discharge: 2017-05-20 | DRG: 481 | Disposition: A | Discharge status: Skilled Nursing Facility | Payer: PPO | Attending: Orthopedic Surgery | Admitting: Orthopedic Surgery

## 2017-05-16 ENCOUNTER — Inpatient Hospital Stay (HOSPITAL_COMMUNITY): Payer: PPO | Admitting: Anesthesiology

## 2017-05-16 ENCOUNTER — Emergency Department (HOSPITAL_COMMUNITY): Payer: PPO

## 2017-05-16 ENCOUNTER — Other Ambulatory Visit: Payer: Self-pay

## 2017-05-16 DIAGNOSIS — S72009A Fracture of unspecified part of neck of unspecified femur, initial encounter for closed fracture: Secondary | ICD-10-CM | POA: Diagnosis present

## 2017-05-16 DIAGNOSIS — S79911A Unspecified injury of right hip, initial encounter: Secondary | ICD-10-CM | POA: Diagnosis not present

## 2017-05-16 DIAGNOSIS — W010XXA Fall on same level from slipping, tripping and stumbling without subsequent striking against object, initial encounter: Secondary | ICD-10-CM | POA: Diagnosis present

## 2017-05-16 DIAGNOSIS — F419 Anxiety disorder, unspecified: Secondary | ICD-10-CM | POA: Diagnosis not present

## 2017-05-16 DIAGNOSIS — I2699 Other pulmonary embolism without acute cor pulmonale: Secondary | ICD-10-CM | POA: Diagnosis not present

## 2017-05-16 DIAGNOSIS — S72002A Fracture of unspecified part of neck of left femur, initial encounter for closed fracture: Secondary | ICD-10-CM | POA: Diagnosis not present

## 2017-05-16 DIAGNOSIS — M549 Dorsalgia, unspecified: Secondary | ICD-10-CM | POA: Diagnosis present

## 2017-05-16 DIAGNOSIS — S7222XD Displaced subtrochanteric fracture of left femur, subsequent encounter for closed fracture with routine healing: Secondary | ICD-10-CM | POA: Diagnosis not present

## 2017-05-16 DIAGNOSIS — S299XXA Unspecified injury of thorax, initial encounter: Secondary | ICD-10-CM | POA: Diagnosis not present

## 2017-05-16 DIAGNOSIS — I82409 Acute embolism and thrombosis of unspecified deep veins of unspecified lower extremity: Secondary | ICD-10-CM | POA: Diagnosis not present

## 2017-05-16 DIAGNOSIS — D62 Acute posthemorrhagic anemia: Secondary | ICD-10-CM | POA: Diagnosis not present

## 2017-05-16 DIAGNOSIS — Z90711 Acquired absence of uterus with remaining cervical stump: Secondary | ICD-10-CM

## 2017-05-16 DIAGNOSIS — M810 Age-related osteoporosis without current pathological fracture: Secondary | ICD-10-CM | POA: Diagnosis present

## 2017-05-16 DIAGNOSIS — G8911 Acute pain due to trauma: Secondary | ICD-10-CM | POA: Diagnosis not present

## 2017-05-16 DIAGNOSIS — G8929 Other chronic pain: Secondary | ICD-10-CM | POA: Diagnosis present

## 2017-05-16 DIAGNOSIS — R918 Other nonspecific abnormal finding of lung field: Secondary | ICD-10-CM | POA: Diagnosis not present

## 2017-05-16 DIAGNOSIS — Y9301 Activity, walking, marching and hiking: Secondary | ICD-10-CM | POA: Diagnosis present

## 2017-05-16 DIAGNOSIS — R600 Localized edema: Secondary | ICD-10-CM | POA: Diagnosis not present

## 2017-05-16 DIAGNOSIS — S7222XA Displaced subtrochanteric fracture of left femur, initial encounter for closed fracture: Principal | ICD-10-CM

## 2017-05-16 DIAGNOSIS — S72142A Displaced intertrochanteric fracture of left femur, initial encounter for closed fracture: Secondary | ICD-10-CM | POA: Diagnosis not present

## 2017-05-16 DIAGNOSIS — E559 Vitamin D deficiency, unspecified: Secondary | ICD-10-CM | POA: Diagnosis not present

## 2017-05-16 DIAGNOSIS — Z419 Encounter for procedure for purposes other than remedying health state, unspecified: Secondary | ICD-10-CM | POA: Diagnosis not present

## 2017-05-16 DIAGNOSIS — G629 Polyneuropathy, unspecified: Secondary | ICD-10-CM | POA: Diagnosis present

## 2017-05-16 DIAGNOSIS — M546 Pain in thoracic spine: Secondary | ICD-10-CM | POA: Diagnosis not present

## 2017-05-16 DIAGNOSIS — Y92012 Bathroom of single-family (private) house as the place of occurrence of the external cause: Secondary | ICD-10-CM | POA: Diagnosis not present

## 2017-05-16 DIAGNOSIS — M25552 Pain in left hip: Secondary | ICD-10-CM | POA: Diagnosis not present

## 2017-05-16 DIAGNOSIS — W19XXXD Unspecified fall, subsequent encounter: Secondary | ICD-10-CM | POA: Diagnosis not present

## 2017-05-16 DIAGNOSIS — Z8249 Family history of ischemic heart disease and other diseases of the circulatory system: Secondary | ICD-10-CM | POA: Diagnosis not present

## 2017-05-16 DIAGNOSIS — R0600 Dyspnea, unspecified: Secondary | ICD-10-CM | POA: Diagnosis not present

## 2017-05-16 DIAGNOSIS — D68318 Other hemorrhagic disorder due to intrinsic circulating anticoagulants, antibodies, or inhibitors: Secondary | ICD-10-CM | POA: Diagnosis not present

## 2017-05-16 DIAGNOSIS — S8991XA Unspecified injury of right lower leg, initial encounter: Secondary | ICD-10-CM | POA: Diagnosis not present

## 2017-05-16 HISTORY — PX: INTRAMEDULLARY (IM) NAIL INTERTROCHANTERIC: SHX5875

## 2017-05-16 HISTORY — DX: Age-related osteoporosis without current pathological fracture: M81.0

## 2017-05-16 LAB — BASIC METABOLIC PANEL
Anion gap: 9 (ref 5–15)
BUN: 12 mg/dL (ref 6–20)
CALCIUM: 9.1 mg/dL (ref 8.9–10.3)
CO2: 21 mmol/L — ABNORMAL LOW (ref 22–32)
CREATININE: 0.72 mg/dL (ref 0.44–1.00)
Chloride: 105 mmol/L (ref 101–111)
GFR calc non Af Amer: >60 mL/min (ref >60)
Glucose, Bld: 134 mg/dL — ABNORMAL HIGH (ref 65–99)
Potassium: 3.7 mmol/L (ref 3.5–5.1)
SODIUM: 135 mmol/L (ref 135–145)

## 2017-05-16 LAB — CBC WITH DIFFERENTIAL/PLATELET
BASOS PCT: 0 %
Basophils Absolute: 0 10*3/uL (ref 0.0–0.1)
EOS ABS: 0 10*3/uL (ref 0.0–0.7)
Eosinophils Relative: 0 %
HCT: 36.8 % (ref 36.0–46.0)
HEMOGLOBIN: 12.2 g/dL (ref 12.0–15.0)
Lymphocytes Relative: 7 %
Lymphs Abs: 0.8 10*3/uL (ref 0.7–4.0)
MCH: 27.4 pg (ref 26.0–34.0)
MCHC: 33.2 g/dL (ref 30.0–36.0)
MCV: 82.5 fL (ref 78.0–100.0)
MONOS PCT: 4 %
Monocytes Absolute: 0.5 10*3/uL (ref 0.1–1.0)
NEUTROS PCT: 89 %
Neutro Abs: 10.8 10*3/uL — ABNORMAL HIGH (ref 1.7–7.7)
PLATELETS: 234 10*3/uL (ref 150–400)
RBC: 4.46 MIL/uL (ref 3.87–5.11)
RDW: 13.2 % (ref 11.5–15.5)
WBC: 12.1 10*3/uL — AB (ref 4.0–10.5)

## 2017-05-16 LAB — TYPE AND SCREEN
ABO/RH(D): O POS
Antibody Screen: NEGATIVE

## 2017-05-16 LAB — TROPONIN I: Troponin I: <0.03 ng/mL (ref <0.03)

## 2017-05-16 LAB — PROTIME-INR
INR: 1.03
PROTHROMBIN TIME: 13.4 s (ref 11.4–15.2)

## 2017-05-16 LAB — ABO/RH: ABO/RH(D): O POS

## 2017-05-16 SURGERY — FIXATION, FRACTURE, INTERTROCHANTERIC, WITH INTRAMEDULLARY ROD
Anesthesia: General | Site: Hip | Laterality: Left

## 2017-05-16 MED ORDER — LIDOCAINE HCL (CARDIAC) 20 MG/ML IV SOLN
INTRAVENOUS | Status: DC | PRN
  Administered 2017-05-16: 80 mg via INTRAVENOUS

## 2017-05-16 MED ORDER — CEFAZOLIN SODIUM-DEXTROSE 2-4 GM/100ML-% IV SOLN
2.0000 g | INTRAVENOUS | Status: AC
  Administered 2017-05-16: 2 g via INTRAVENOUS

## 2017-05-16 MED ORDER — ACETAMINOPHEN 325 MG PO TABS
325.0000 mg | ORAL_TABLET | Freq: Four times a day (QID) | ORAL | Status: DC | PRN
  Administered 2017-05-19: 650 mg via ORAL
  Filled 2017-05-16: qty 2

## 2017-05-16 MED ORDER — CHLORHEXIDINE GLUCONATE 4 % EX LIQD
60.0000 mL | Freq: Once | CUTANEOUS | Status: DC
  Filled 2017-05-16: qty 60

## 2017-05-16 MED ORDER — SUGAMMADEX SODIUM 200 MG/2ML IV SOLN
INTRAVENOUS | Status: DC | PRN
  Administered 2017-05-16: 150 mg via INTRAVENOUS

## 2017-05-16 MED ORDER — METOCLOPRAMIDE HCL 5 MG PO TABS: 5.0000 mg | ORAL_TABLET | Freq: Three times a day (TID) | ORAL | Status: DC | PRN

## 2017-05-16 MED ORDER — METHOCARBAMOL 500 MG PO TABS
500.0000 mg | ORAL_TABLET | Freq: Four times a day (QID) | ORAL | Status: DC | PRN
  Administered 2017-05-16 – 2017-05-20 (×10): 500 mg via ORAL
  Filled 2017-05-16 (×10): qty 1

## 2017-05-16 MED ORDER — TRAMADOL HCL 50 MG PO TABS
50.0000 mg | ORAL_TABLET | Freq: Four times a day (QID) | ORAL | Status: DC
  Administered 2017-05-16 – 2017-05-17 (×4): 50 mg via ORAL
  Filled 2017-05-16 (×4): qty 1

## 2017-05-16 MED ORDER — DEXAMETHASONE SODIUM PHOSPHATE 10 MG/ML IJ SOLN
INTRAMUSCULAR | Status: DC | PRN
  Administered 2017-05-16: 10 mg via INTRAVENOUS

## 2017-05-16 MED ORDER — PROMETHAZINE HCL 25 MG/ML IJ SOLN: 6.2500 mg | INTRAMUSCULAR | Status: DC | PRN

## 2017-05-16 MED ORDER — ROCURONIUM BROMIDE 100 MG/10ML IV SOLN
INTRAVENOUS | Status: DC | PRN
  Administered 2017-05-16: 10 mg via INTRAVENOUS
  Administered 2017-05-16: 50 mg via INTRAVENOUS

## 2017-05-16 MED ORDER — ONDANSETRON HCL 4 MG PO TABS: 4.0000 mg | ORAL_TABLET | Freq: Four times a day (QID) | ORAL | Status: DC | PRN

## 2017-05-16 MED ORDER — DULOXETINE HCL 60 MG PO CPEP
60.0000 mg | ORAL_CAPSULE | Freq: Every day | ORAL | Status: DC
  Administered 2017-05-17 – 2017-05-20 (×4): 60 mg via ORAL
  Filled 2017-05-16 (×4): qty 1

## 2017-05-16 MED ORDER — LACTATED RINGERS IV SOLN: INTRAVENOUS | Status: AC

## 2017-05-16 MED ORDER — FENTANYL CITRATE (PF) 100 MCG/2ML IJ SOLN
50.0000 ug | INTRAMUSCULAR | Status: AC | PRN
  Administered 2017-05-16 (×2): 50 ug via INTRAVENOUS
  Filled 2017-05-16 (×2): qty 2

## 2017-05-16 MED ORDER — MIDAZOLAM HCL 5 MG/5ML IJ SOLN
INTRAMUSCULAR | Status: DC | PRN
  Administered 2017-05-16: 2 mg via INTRAVENOUS

## 2017-05-16 MED ORDER — PHENYLEPHRINE 40 MCG/ML (10ML) SYRINGE FOR IV PUSH (FOR BLOOD PRESSURE SUPPORT)
PREFILLED_SYRINGE | INTRAVENOUS | Status: DC | PRN
  Administered 2017-05-16 (×3): 80 ug via INTRAVENOUS
  Administered 2017-05-16: 120 ug via INTRAVENOUS
  Administered 2017-05-16: 40 ug via INTRAVENOUS

## 2017-05-16 MED ORDER — DOCUSATE SODIUM 100 MG PO CAPS
100.0000 mg | ORAL_CAPSULE | Freq: Two times a day (BID) | ORAL | Status: DC
  Administered 2017-05-16 – 2017-05-20 (×7): 100 mg via ORAL
  Filled 2017-05-16 (×8): qty 1

## 2017-05-16 MED ORDER — 0.9 % SODIUM CHLORIDE (POUR BTL) OPTIME
TOPICAL | Status: DC | PRN
  Administered 2017-05-16: 1000 mL

## 2017-05-16 MED ORDER — OXYCODONE HCL 5 MG/5ML PO SOLN: 5.0000 mg | Freq: Once | ORAL | Status: DC | PRN

## 2017-05-16 MED ORDER — HYDROMORPHONE HCL 1 MG/ML IJ SOLN
INTRAMUSCULAR | Status: AC
  Filled 2017-05-16: qty 1

## 2017-05-16 MED ORDER — HYDROCODONE-ACETAMINOPHEN 5-325 MG PO TABS: 1.0000 | ORAL_TABLET | ORAL | Status: DC | PRN

## 2017-05-16 MED ORDER — ONDANSETRON HCL 4 MG/2ML IJ SOLN
4.0000 mg | Freq: Four times a day (QID) | INTRAMUSCULAR | Status: DC | PRN
  Administered 2017-05-18: 4 mg via INTRAVENOUS
  Filled 2017-05-16: qty 2

## 2017-05-16 MED ORDER — HYDROMORPHONE HCL 1 MG/ML IJ SOLN
0.5000 mg | INTRAMUSCULAR | Status: DC | PRN
  Administered 2017-05-16 – 2017-05-19 (×11): 0.5 mg via INTRAVENOUS
  Filled 2017-05-16 (×11): qty 1

## 2017-05-16 MED ORDER — FENTANYL CITRATE (PF) 250 MCG/5ML IJ SOLN
INTRAMUSCULAR | Status: AC
  Filled 2017-05-16: qty 5

## 2017-05-16 MED ORDER — LACTATED RINGERS IV SOLN
INTRAVENOUS | Status: DC
  Administered 2017-05-16: 02:00:00 via INTRAVENOUS

## 2017-05-16 MED ORDER — FLEET ENEMA 7-19 GM/118ML RE ENEM: 1.0000 | ENEMA | Freq: Once | RECTAL | Status: DC | PRN

## 2017-05-16 MED ORDER — PROPOFOL 10 MG/ML IV BOLUS
INTRAVENOUS | Status: DC | PRN
  Administered 2017-05-16: 30 mg via INTRAVENOUS
  Administered 2017-05-16: 140 mg via INTRAVENOUS

## 2017-05-16 MED ORDER — FENTANYL CITRATE (PF) 100 MCG/2ML IJ SOLN
INTRAMUSCULAR | Status: DC | PRN
  Administered 2017-05-16 (×4): 50 ug via INTRAVENOUS

## 2017-05-16 MED ORDER — SENNOSIDES-DOCUSATE SODIUM 8.6-50 MG PO TABS: 1.0000 | ORAL_TABLET | Freq: Every evening | ORAL | Status: DC | PRN

## 2017-05-16 MED ORDER — BISACODYL 5 MG PO TBEC: 5.0000 mg | DELAYED_RELEASE_TABLET | Freq: Every day | ORAL | Status: DC | PRN

## 2017-05-16 MED ORDER — DEXAMETHASONE SODIUM PHOSPHATE 10 MG/ML IJ SOLN
INTRAMUSCULAR | Status: AC
  Filled 2017-05-16: qty 1

## 2017-05-16 MED ORDER — MIDAZOLAM HCL 2 MG/2ML IJ SOLN
INTRAMUSCULAR | Status: AC
  Filled 2017-05-16: qty 2

## 2017-05-16 MED ORDER — HYDROMORPHONE HCL 1 MG/ML IJ SOLN
0.2500 mg | INTRAMUSCULAR | Status: DC | PRN
  Administered 2017-05-16: 0.5 mg via INTRAVENOUS

## 2017-05-16 MED ORDER — ENOXAPARIN SODIUM 40 MG/0.4ML ~~LOC~~ SOLN
40.0000 mg | SUBCUTANEOUS | Status: DC
  Administered 2017-05-17 – 2017-05-20 (×4): 40 mg via SUBCUTANEOUS
  Filled 2017-05-16 (×5): qty 0.4

## 2017-05-16 MED ORDER — ONDANSETRON HCL 4 MG/2ML IJ SOLN
INTRAMUSCULAR | Status: DC | PRN
  Administered 2017-05-16: 4 mg via INTRAVENOUS

## 2017-05-16 MED ORDER — POVIDONE-IODINE 10 % EX SWAB: 2.0000 "application " | Freq: Once | CUTANEOUS | Status: DC

## 2017-05-16 MED ORDER — PANTOPRAZOLE SODIUM 40 MG PO TBEC
40.0000 mg | DELAYED_RELEASE_TABLET | Freq: Every day | ORAL | Status: DC
  Administered 2017-05-17 – 2017-05-19 (×3): 40 mg via ORAL
  Filled 2017-05-16 (×4): qty 1

## 2017-05-16 MED ORDER — LIDOCAINE HCL (CARDIAC) 20 MG/ML IV SOLN
INTRAVENOUS | Status: AC
  Filled 2017-05-16: qty 5

## 2017-05-16 MED ORDER — MORPHINE SULFATE (PF) 2 MG/ML IV SOLN: 0.5000 mg | INTRAVENOUS | Status: DC | PRN

## 2017-05-16 MED ORDER — OXYCODONE HCL 5 MG PO TABS: 5.0000 mg | ORAL_TABLET | Freq: Once | ORAL | Status: DC | PRN

## 2017-05-16 MED ORDER — METOCLOPRAMIDE HCL 5 MG/ML IJ SOLN: 5.0000 mg | Freq: Three times a day (TID) | INTRAMUSCULAR | Status: DC | PRN

## 2017-05-16 MED ORDER — METHOCARBAMOL 1000 MG/10ML IJ SOLN
500.0000 mg | Freq: Four times a day (QID) | INTRAVENOUS | Status: DC | PRN
  Filled 2017-05-16: qty 5

## 2017-05-16 MED ORDER — CEFAZOLIN SODIUM-DEXTROSE 2-3 GM-%(50ML) IV SOLR: INTRAVENOUS | Status: DC | PRN

## 2017-05-16 MED ORDER — ROCURONIUM BROMIDE 10 MG/ML (PF) SYRINGE
PREFILLED_SYRINGE | INTRAVENOUS | Status: AC
  Filled 2017-05-16: qty 5

## 2017-05-16 MED ORDER — PROPOFOL 10 MG/ML IV BOLUS
INTRAVENOUS | Status: AC
  Filled 2017-05-16: qty 20

## 2017-05-16 MED ORDER — DIPHENHYDRAMINE HCL 12.5 MG/5ML PO ELIX
12.5000 mg | ORAL_SOLUTION | ORAL | Status: DC | PRN
  Filled 2017-05-16: qty 10

## 2017-05-16 MED ORDER — HYDROCODONE-ACETAMINOPHEN 7.5-325 MG PO TABS: 1.0000 | ORAL_TABLET | ORAL | Status: DC | PRN

## 2017-05-16 MED ORDER — ONDANSETRON HCL 4 MG/2ML IJ SOLN
INTRAMUSCULAR | Status: AC
  Filled 2017-05-16: qty 2

## 2017-05-16 MED ORDER — CEFAZOLIN SODIUM-DEXTROSE 2-4 GM/100ML-% IV SOLN
INTRAVENOUS | Status: AC
  Filled 2017-05-16: qty 100

## 2017-05-16 MED ORDER — LACTATED RINGERS IV SOLN
INTRAVENOUS | Status: DC
  Administered 2017-05-16 – 2017-05-18 (×3): via INTRAVENOUS

## 2017-05-16 MED ORDER — ALPRAZOLAM 0.5 MG PO TABS
0.5000 mg | ORAL_TABLET | Freq: Every evening | ORAL | Status: DC | PRN
  Administered 2017-05-17 – 2017-05-20 (×4): 0.5 mg via ORAL
  Filled 2017-05-16 (×4): qty 1

## 2017-05-16 MED ORDER — PHENYLEPHRINE HCL 10 MG/ML IJ SOLN
INTRAVENOUS | Status: DC | PRN
  Administered 2017-05-16: 20 ug/min via INTRAVENOUS

## 2017-05-16 SURGICAL SUPPLY — 49 items
BIT DRILL 4.3MMS DISTAL GRDTED (BIT) ×1 IMPLANT
BRUSH SCRUB SURG 4.25 DISP (MISCELLANEOUS) ×3 IMPLANT
COVER PERINEAL POST (MISCELLANEOUS) ×3 IMPLANT
COVER SURGICAL LIGHT HANDLE (MISCELLANEOUS) ×6 IMPLANT
DRAPE C-ARM 42X72 X-RAY (DRAPES) ×3 IMPLANT
DRAPE C-ARMOR (DRAPES) ×3 IMPLANT
DRAPE HALF SHEET 40X57 (DRAPES) ×3 IMPLANT
DRAPE ORTHO SPLIT 77X108 STRL (DRAPES) ×4
DRAPE STERI IOBAN 125X83 (DRAPES) ×3 IMPLANT
DRAPE SURG ORHT 6 SPLT 77X108 (DRAPES) ×2 IMPLANT
DRAPE U-SHAPE 47X51 STRL (DRAPES) ×3 IMPLANT
DRILL 4.3MMS DISTAL GRADUATED (BIT) ×3
DRSG MEPILEX BORDER 4X4 (GAUZE/BANDAGES/DRESSINGS) ×9 IMPLANT
DRSG MEPILEX BORDER 4X8 (GAUZE/BANDAGES/DRESSINGS) ×3 IMPLANT
ELECT REM PT RETURN 9FT ADLT (ELECTROSURGICAL)
ELECTRODE REM PT RTRN 9FT ADLT (ELECTROSURGICAL) IMPLANT
GLOVE BIO SURGEON STRL SZ8 (GLOVE) ×6 IMPLANT
GLOVE BIOGEL PI IND STRL 8 (GLOVE) ×1 IMPLANT
GLOVE BIOGEL PI INDICATOR 8 (GLOVE) ×2
GOWN STRL REUS W/ TWL LRG LVL3 (GOWN DISPOSABLE) ×2 IMPLANT
GOWN STRL REUS W/ TWL XL LVL3 (GOWN DISPOSABLE) ×1 IMPLANT
GOWN STRL REUS W/TWL LRG LVL3 (GOWN DISPOSABLE) ×4
GOWN STRL REUS W/TWL XL LVL3 (GOWN DISPOSABLE) ×2
GUIDEPIN 3.2X17.5 THRD DISP (PIN) ×3 IMPLANT
GUIDEWIRE BALL NOSE 100CM (WIRE) ×3 IMPLANT
HFN LAG SCREW 10.5MM X 85MM (Screw) ×3 IMPLANT
HFN LH 130 DEG 9MM X 360MM (Nail) ×3 IMPLANT
HIP FRA NAIL LAG SCREW 10.5X90 (Orthopedic Implant) ×3 IMPLANT
KIT BASIN OR (CUSTOM PROCEDURE TRAY) ×3 IMPLANT
KIT TURNOVER KIT B (KITS) ×3 IMPLANT
LINER BOOT UNIVERSAL DISP (MISCELLANEOUS) ×3 IMPLANT
NS IRRIG 1000ML POUR BTL (IV SOLUTION) ×3 IMPLANT
PACK GENERAL/GYN (CUSTOM PROCEDURE TRAY) ×3 IMPLANT
PAD ARMBOARD 7.5X6 YLW CONV (MISCELLANEOUS) ×9 IMPLANT
SCREW BONE CORTICAL 5.0X42 (Screw) ×3 IMPLANT
SCREW BONE CORTICAL 5.0X44 (Screw) ×3 IMPLANT
SCREW DRILL BIT ANIT ROTATION (BIT) ×3 IMPLANT
SCREW LAG HIP FRA NAIL 10.5X90 (Orthopedic Implant) ×1 IMPLANT
STAPLER VISISTAT 35W (STAPLE) ×3 IMPLANT
SUT ETHILON 3 0 PS 1 (SUTURE) ×6 IMPLANT
SUT VIC AB 0 CT1 27 (SUTURE) ×2
SUT VIC AB 0 CT1 27XBRD ANBCTR (SUTURE) ×1 IMPLANT
SUT VIC AB 1 CT1 27 (SUTURE) ×2
SUT VIC AB 1 CT1 27XBRD ANBCTR (SUTURE) ×1 IMPLANT
SUT VIC AB 2-0 CT1 27 (SUTURE) ×2
SUT VIC AB 2-0 CT1 TAPERPNT 27 (SUTURE) ×1 IMPLANT
TOWEL OR 17X24 6PK STRL BLUE (TOWEL DISPOSABLE) ×3 IMPLANT
TOWEL OR 17X26 10 PK STRL BLUE (TOWEL DISPOSABLE) ×6 IMPLANT
WATER STERILE IRR 1000ML POUR (IV SOLUTION) IMPLANT

## 2017-05-16 NOTE — Anesthesia Preprocedure Evaluation (Addendum)
Anesthesia Evaluation  Patient identified by MRN, date of birth, ID band Patient awake    Reviewed: Allergy & Precautions, H&P , NPO status , Patient's Chart, lab work & pertinent test results  Airway Mallampati: I  TM Distance: >3 FB Neck ROM: full    Dental no notable dental hx. (+) Teeth Intact, Dental Advisory Given, Edentulous Upper   Pulmonary neg pulmonary ROS,    Pulmonary exam normal breath sounds clear to auscultation       Cardiovascular negative cardio ROS Normal cardiovascular exam Rhythm:Regular Rate:Normal     Neuro/Psych PSYCHIATRIC DISORDERS Anxiety negative neurological ROS     GI/Hepatic negative GI ROS, Neg liver ROS,   Endo/Other  negative endocrine ROS  Renal/GU negative Renal ROS  negative genitourinary   Musculoskeletal negative musculoskeletal ROS (+)   Abdominal Normal abdominal exam  (+)   Peds negative pediatric ROS (+)  Hematology negative hematology ROS (+)   Anesthesia Other Findings Recent cataract surgery per patient. 2 weeks post op.  Reproductive/Obstetrics negative OB ROS                            Anesthesia Physical  Anesthesia Plan  ASA: II  Anesthesia Plan: General   Post-op Pain Management:    Induction: Intravenous  PONV Risk Score and Plan: 3 and Ondansetron, Dexamethasone, Midazolam and Treatment may vary due to age or medical condition  Airway Management Planned: Oral ETT  Additional Equipment:   Intra-op Plan:   Post-operative Plan: Extubation in OR  Informed Consent: I have reviewed the patients History and Physical, chart, labs and discussed the procedure including the risks, benefits and alternatives for the proposed anesthesia with the patient or authorized representative who has indicated his/her understanding and acceptance.     Plan Discussed with: CRNA  Anesthesia Plan Comments:         Anesthesia Quick  Evaluation

## 2017-05-16 NOTE — Consult Note (Signed)
Orthopaedic Trauma Service Consultation  Reason for Consult: Left hip fracture Referring Physician: Varney Biles MD  ALAYNE Dickerson is an 72 y.o. female.  HPI: Ground level fall; h/o osteoporosis. Unable to bear weight subsequently on left leg. Pain.  Past Medical History:  Diagnosis Date  . Anxiety     Past Surgical History:  Procedure Laterality Date  . ANTERIOR AND POSTERIOR REPAIR  04/04/2011   Procedure: ANTERIOR (CYSTOCELE) AND POSTERIOR REPAIR (RECTOCELE);  Surgeon: Gus Height, MD;  Location: [facility];  Service: Gynecology;  Laterality: N/A;  . LESION EXCISION  40yr ago  . PARTIAL HYSTERECTOMY  411yrago    Family History  Problem Relation Age of Onset  . Congestive Heart Failure Mother   . Atrial fibrillation Sister   . CAD Neg Hx     Social History:  reports that she has never smoked. She has never used smokeless tobacco. She reports that she drinks about 0.6 oz of alcohol per week. She reports that she does not use drugs.  Allergies:  Allergies  Allergen Reactions  . Morphine And Related Shortness Of Breath    Medications:  Prior to Admission:  Medications Prior to Admission  Medication Sig Dispense Refill Last Dose  . albuterol (PROAIR HFA) 108 (90 Base) MCG/ACT inhaler 2 puffs every 4 hours as needed only  if your can't catch your breath 1 Inhaler 1 unk  . ALPRAZolam (XANAX) 1 MG tablet Take 0.5 mg by mouth at bedtime as needed for anxiety or sleep.   ybj  . cholecalciferol (VITAMIN D) 1000 units tablet Take 1,000 Units by mouth daily.   Past Week at Unknown time  . Coenzyme Q10 (COQ10 PO) Take 1 tablet by mouth daily.   Past Week at Unknown time  . DULoxetine (CYMBALTA) 60 MG capsule Take 60 mg by mouth daily.   05/15/2017 at Unknown time  . estrogens, conjugated, (PREMARIN) 0.45 MG tablet Take 0.45 mg by mouth daily. Take daily for 21 days then do not take for 7 days.   05/15/2017 at Unknown time  . PRESCRIPTION MEDICATION Place 1 drop into the right eye 2  (two) times daily. Combo eye drop from eye doc   05/15/2017 at Unknown time  . vitamin C (ASCORBIC ACID) 500 MG tablet Take 500 mg by mouth daily.   Past Week at Unknown time  . VITAMIN E PO Take 1 tablet by mouth daily.   Past Week at Unknown time    Results for orders placed or performed during the hospital encounter of 05/16/17 (from the past 48 hour(s))  Basic metabolic panel     Status: Abnormal   Collection Time: 05/16/17  1:41 AM  Result Value Ref Range   Sodium 135 135 - 145 mmol/L   Potassium 3.7 3.5 - 5.1 mmol/L   Chloride 105 101 - 111 mmol/L   CO2 21 (L) 22 - 32 mmol/L   Glucose, Bld 134 (H) 65 - 99 mg/dL   BUN 12 6 - 20 mg/dL   Creatinine, Ser 0.72 0.44 - 1.00 mg/dL   Calcium 9.1 8.9 - 10.3 mg/dL   GFR calc non Af Amer >60 >60 mL/min   GFR calc Af Amer >60 >60 mL/min    Comment: (NOTE) The eGFR has been calculated using the CKD EPI equation. This calculation has not been validated in all clinical situations. eGFR's persistently <60 mL/min signify possible Chronic Kidney Disease.    Anion gap 9 5 - 15    Comment: Performed  at [facility], [place] [address] [place], [place] [address]  CBC WITH DIFFERENTIAL     Status: Abnormal   Collection Time: 05/16/17  1:41 AM  Result Value Ref Range   WBC 12.1 (H) 4.0 - 10.5 K/uL   RBC 4.46 3.87 - 5.11 MIL/uL   Hemoglobin 12.2 12.0 - 15.0 g/dL   HCT 36.8 36.0 - 46.0 %   MCV 82.5 78.0 - 100.0 fL   MCH 27.4 26.0 - 34.0 pg   MCHC 33.2 30.0 - 36.0 g/dL   RDW 13.2 11.5 - 15.5 %   Platelets 234 150 - 400 K/uL   Neutrophils Relative % 89 %   Neutro Abs 10.8 (H) 1.7 - 7.7 K/uL   Lymphocytes Relative 7 %   Lymphs Abs 0.8 0.7 - 4.0 K/uL   Monocytes Relative 4 %   Monocytes Absolute 0.5 0.1 - 1.0 K/uL   Eosinophils Relative 0 %   Eosinophils Absolute 0.0 0.0 - 0.7 K/uL   Basophils Relative 0 %   Basophils Absolute 0.0 0.0 - 0.1 K/uL    Comment: Performed at [facility] [address] [place], [place] [address]   Protime-INR     Status: None   Collection Time: 05/16/17  1:41 AM  Result Value Ref Range   Prothrombin Time 13.4 11.4 - 15.2 seconds   INR 1.03     Comment: Performed at [facility] [address], [place], [place] [address]  Type and screen [place]     Status: None   Collection Time: 05/16/17  2:00 AM  Result Value Ref Range   ABO/RH(D) O POS    Antibody Screen NEG    Sample Expiration      05/19/2017 Performed at [facility], [place] [address], [place], [place] [address]   ABO/Rh     Status: None   Collection Time: 05/16/17  2:00 AM  Result Value Ref Range   ABO/RH(D)      O POS Performed at [facility] [address] [place], [place] [address]   Troponin I     Status: None   Collection Time: 05/16/17  5:09 AM  Result Value Ref Range   Troponin I <0.03 <0.03 ng/mL    Comment: Performed at [facility] [address], [place],  [address]    Dg Chest 1 View  Result Date: 05/16/2017 CLINICAL DATA:  72 year old female status post fall in bathroom tonight with left hip fracture. EXAM: CHEST  1 VIEW COMPARISON:  Chest radiographs 08/09/2016. FINDINGS: Lower lung volumes. Allowing for portable technique the lungs are clear. Mediastinal contours remain within normal limits. Visualized tracheal air column is within normal limits. Negative visible bowel gas pattern. IMPRESSION: No acute cardiopulmonary abnormality. Electronically Signed   By: Genevie Ann M.D.   On: 05/16/2017 02:54   Dg Hip Unilat With Pelvis 2-3 Views Left  Result Date: 05/16/2017 CLINICAL DATA:  72 year old female status post fall in bathroom tonight with left hip pain. EXAM: DG HIP (WITH OR WITHOUT PELVIS) 2-3V LEFT COMPARISON:  CT Abdomen and Pelvis 02/16/2013. FINDINGS: Comminuted, impacted, and displaced proximal left femur intertrochanteric fracture. There is 1 full shaft width medial displacement of the distal fragment. There is mild posterior angulation. The  left femoral head remains normally located. The pelvis appears intact. The proximal right femur appears intact. Probable rounded calcified injection granuloma projecting over the right greater trochanter. Negative visible bowel gas  pattern. IMPRESSION: Comminuted left femur intertrochanteric fracture with varus impaction and 1 full shaft width medial displacement. Electronically Signed   By: Genevie Ann M.D.   On: 05/16/2017 02:53    ROS No recent fever, bleeding abnormalities, urologic dysfunction, GI problems, or weight gain. Blood pressure 124/71, pulse 75, temperature 98.4 F (36.9 C), temperature source Oral, resp. rate 15, height 5' 2"  (1.575 m), weight 59 kg (130 lb), SpO2 95 %. Physical Exam  NCAT RRR CTA without wheezing LLE No traumatic wounds, ecchymosis, or rash  Tender hip with shortening and external rotation  No knee or ankle effusion  Sens DPN, SPN, TN intact  Motor EHL, ext, flex, evers 5/5  DP 2+, PT 2+, No significant edema  Assessment/Plan: Left subtroch femur fracture  I discussed with the patient the risks and benefits of surgery, including the possibility of infection, nerve injury, vessel injury, wound breakdown, arthritis, symptomatic hardware, DVT/ PE, loss of motion, malunion, nonunion, and need for further surgery among others.  She acknowledged these risks and wished to proceed.   [place], MD [facility] [phone] (p)  05/16/2017  11:21 AM

## 2017-05-16 NOTE — Anesthesia Procedure Notes (Signed)
Procedure Name: Intubation Date/Time: 05/16/2017 11:56 AM Performed by: Lovie Cholock, Nidya Bouyer K, CRNA Pre-anesthesia Checklist: Patient identified, Emergency Drugs available, Suction available and Patient being monitored Patient Re-evaluated:Patient Re-evaluated prior to induction Oxygen Delivery Method: Circle System Utilized Preoxygenation: Pre-oxygenation with 100% oxygen Induction Type: IV induction Ventilation: Mask ventilation without difficulty Laryngoscope Size: Miller and 2 Grade View: Grade I Tube type: Oral Tube size: 7.0 mm Number of attempts: 1 Airway Equipment and Method: Stylet and Oral airway Placement Confirmation: ETT inserted through vocal cords under direct vision,  positive ETCO2 and breath sounds checked- equal and bilateral Secured at: 20 cm Tube secured with: Tape Dental Injury: Teeth and Oropharynx as per pre-operative assessment

## 2017-05-16 NOTE — ED Triage Notes (Signed)
Patient was attempting to use the bathroom and fell onto left leg.

## 2017-05-16 NOTE — Clinical Social Work Note (Signed)
Clinical Social Work Assessment  Patient Details  Name: Katie CroftShirley W Kraszewski MRN: [id number] Date of Birth: [birth year]  Date of referral:  05/16/17               Reason for consult:  Facility Placement                Permission sought to share information with:  Family Supports Permission granted to share information::  Yes, Verbal Permission Granted  Name::     Donn PieriniJeff [place]ritchfield   Agency::  family  Relationship::  husband  Contact Information:  Donn PieriniJeff [place] [phone]  Housing/Transportation Living arrangements for the past 2 months:  Single Family Home(with husband. ) Source of Information:  Patient Patient Interpreter Needed:  None Criminal Activity/Legal Involvement Pertinent to Current Situation/Hospitalization:  No - Comment as needed Significant Relationships:  Adult Children, Merchandiser, retailCommunity Support, Other Family Members, Spouse Lives with:  Spouse Do you feel safe going back to the place where you live?  Yes Need for family participation in patient care:  Yes (Comment)  Care giving concerns: CSW spoke with pt at bedside. At this time pt expressed no concerns to CSW. Pt did express that pt is having trouble with back-which pt reports breaking a few years ago. Pt expressed that she has a great support from spouse as well as other community supports.    Social Worker assessment / plan:  CSW spoke with pt at bedside. During this time CSW was informed that pt is from home with husband. Pt reports that husband and pt have been married for over 25 years and pt has adult children that are active as much as possible in pt's care. During this assessment pt presented as calm while lying in bed. Pt was appropriate and agreeable to SNF placement at the time of discharge. Pt expressed that if pt has a choice [facility] and have other family and friends come in and help care for pt as needed.   Employment status:  Retired Health and safety inspectornsurance information:  Other (Comment  Required)(HTA) PT Recommendations:  Not assessed at this time Information / Referral to community resources:  Skilled Nursing Facility(CSW spoke with pt at bedside to dicuss the need for placement at the time of discharge. )  Patient/Family's Response to care: Pt's response to care was appropriate for diagnosis given. Pt's response to care was positive with hope for a great outcome.   Patient/Family's Understanding of and Emotional Response to Diagnosis, Current Treatment, and Prognosis:  No further questions or concerns have been presented to CSW at this time. Pt's emotional response to care was positive and understanding to plan of care at this time.  Emotional Assessment Appearance:  Appears stated age Attitude/Demeanor/Rapport:  Engaged Affect (typically observed):  Appropriate, Pleasant Orientation:  Oriented to Self, Oriented to Place, Oriented to  Time, Oriented to Situation Alcohol / Substance use:  Not Applicable Psych involvement (Current and /or in the community):  No (Comment)  Discharge Needs  Concerns to be addressed:  No discharge needs identified Readmission within the last 30 days:  No Current discharge risk:  Dependent with Mobility Barriers to Discharge:  Continued Medical Work up   Sempra EnergyKierra S Mikela Senn, LCSWA 05/16/2017, 8:16 AM

## 2017-05-16 NOTE — Progress Notes (Signed)
Orthopedic Tech Progress Note Patient Details:  Katie Dickerson November 13, 1945 [name]  Patient ID: Katie CroftShirley W Dickerson, female   DOB: November 13, 1945, 72 y.o.   MRN: [id number] Pt cant have ohf due to age restrictions  Katie Dickerson, Katie Dickerson 05/16/2017, 10:57 PM

## 2017-05-16 NOTE — ED Notes (Signed)
Purwick setup completed

## 2017-05-16 NOTE — ED Notes (Signed)
Orthopedic Provider at bedside. °

## 2017-05-16 NOTE — Op Note (Signed)
05/16/2017  7:29 PM  PATIENT:  Katie Dickerson  72 y.o. female  PRE-OPERATIVE DIAGNOSIS:  Left subtrochanteric femur fracture  POST-OPERATIVE DIAGNOSIS:  Left subtrochanteric femur fracture  PROCEDURE:  Procedure(s): INTRAMEDULLARY (IM) NAIL INTERTROCHANTRIC (Left) with Affixus 9 x 360 mm statically locked  SURGEON:  Surgeon(s) and Role:    Myrene Galas* British Moyd, MD - Primary  PHYSICIAN ASSISTANT: PA Student  ANESTHESIA:   general  EBL:  100 mL   BLOOD ADMINISTERED:none  DRAINS: none   LOCAL MEDICATIONS USED:  NONE  SPECIMEN:  No Specimen  DISPOSITION OF SPECIMEN:  N/A  COUNTS:  YES  TOURNIQUET:  * No tourniquets in log *  DICTATION: .Note written in EPIC  PLAN OF CARE: Admit to inpatient   PATIENT DISPOSITION:  PACU - hemodynamically stable.   Delay start of Pharmacological VTE agent (>24hrs) due to surgical blood loss or risk of bleeding: no  BRIEF SUMMARY AND INDICATION OF PROCEDURE:  Katie Dickerson is a 72 y.o. year-old with multiple medical problems.  I discussed with the patient and family risks and benefits of surgical treatment including the potential for malunion, nonunion, symptomatic hardware, heart attack, stroke, neurovascular injury, bleeding, and others.  After full discussion, the patient and family wished to proceed.  BRIEF SUMMARY OF PROCEDURE:  The patient was taken to the operating room where general anesthesia was induced.  She was positioned supine on the Hana fracture table.  A closed reduction maneuver was performed of the fractured proximal femur and this was confirmed on both AP and lateral xray views. A thorough scrub and wash with chlorhexidine and then Betadine scrub and paint was performed.  After sterile drapes and time-out, a long instrument was used to identify the appropriate starting position under C-arm on both AP and lateral images.  A 3 cm incision was made proximal to the greater trochanter.  The curved cannulated awl was  inserted just medial to the tip of the lateral trochanter and then the starting guidewire advanced into the proximal femur.  This was checked on AP and lateral views.  The starting reamer was engaged with the soft tissue protected by a sleeve.  The curved ball-tipped guidewire was then inserted, making sure it was just posterior as possible in the distal femur and across the fracture site, which stayed in a reduced position.  It was sequentially reamed up to 11 and an 9 mm nail inserted to the appropriate depth.  The guidewire for the lag screw was then inserted with the appropriate anteversion to make sure it was in a center-center position.  This was measured and the lag screw placed with excellent purchase and position checked on both views.  The antirotation screw was then engaged within the groove of the lag screw, which was allowed to telescope.  Traction was released and compression achieved with the screw.  This was followed by placement of one distal locking screw using perfect circle technique.  This was confirmed on AP and lateral images. Wounds were irrigated thoroughly, closed in a standard layered fashion. Sterile gently compressive dressings were applied.  A PA student assisted throughout.  The patient was awakened from anesthesia and transported to the PACU in stable condition.  PROGNOSIS:  The patient will be weightbearing as tolerated with physical therapy beginning DVT prophylaxis as soon as deemed stable by the Primary Care Service.  She has no range of motion precautions.  We will continue to follow through at the hospital.  Anticipate follow up in  the office in 2 weeks for removal of sutures and further evaluation.     Doralee Albino. Carola Frost, M.D.

## 2017-05-16 NOTE — Transfer of Care (Signed)
Immediate Anesthesia Transfer of Care Note  Patient: Katie CroftShirley W Dickerson  Procedure(s) Performed: INTRAMEDULLARY (IM) NAIL INTERTROCHANTRIC (Left Hip)  Patient Location: PACU  Anesthesia Type:General  Level of Consciousness: awake, oriented and patient cooperative  Airway & Oxygen Therapy: Patient Spontanous Breathing and Patient connected to face mask oxygen  Post-op Assessment: Report given to RN and Post -op Vital signs reviewed and stable  Post vital signs: Reviewed  Last Vitals:  Vitals Value Taken Time  BP 97/78 05/16/2017  2:21 PM  Temp    Pulse 87 05/16/2017  2:27 PM  Resp 20 05/16/2017  2:27 PM  SpO2 95 % 05/16/2017  2:27 PM  Vitals shown include unvalidated device data.  Last Pain:  Vitals:   05/16/17 0840  TempSrc:   PainSc: 2          Complications: No apparent anesthesia complications

## 2017-05-16 NOTE — Progress Notes (Signed)
PROGRESS NOTE    Katie Dickerson  WUJ:[id number] DOB: 1945/04/13 DOA: 05/16/2017 PCP: [name]., MD    Brief Narrative:  72 year old female who presented after a fall. She does have a significant past medical history for nonspecific arthritis, and neuropathy. She experienced a mechanical fall, no head trauma or loss of consciousness. Following the fall she had severe left hip pain, unable to ablate. On initial physical examination blood pressure 114/63, heart rate 67, respiratory rate 12, oxygen saturation of 96%. Moist mucous membranes, lungs clear to auscultation bilaterally, heart S1 and S2 present rhythmic, abdomen soft and nontender, no lower extremity  Edema, positive pain on passive movement of the left hip. Sodium 135, potassium 3.7, chloride 105, bicarbonate 21, glucose 134, creatinine 0.72, white count 12.1, hemoglobin 12.2, hematocrit 36.8, platelets 234. Chest x-ray negative for infiltrates. EKG sinus rhythm, left axis deviation, low voltage, normal intervals.   Patient was admitted to the hospital with working diagnosis of left hip fracture.   Assessment & Plan:   Principal Problem:   Closed left hip fracture, initial encounter [name]) Active Problems:   Hip fracture (HCC)   Closed left subtrochanteric femur fracture, initial encounter (HCC)   1. Left hip fracture. Patient had surgery today, will continue post op care, pain control, dvt prophylaxis and physical therapy evaluation.   2. Arthritis. Continue pain control.   3. Neuropathy. Physical therapy evaluation.   4. Anxiety. Will resume alprazolam, and duloxetine.    DVT prophylaxis: enoxaparin   Code Status:  full Family Communication: no family at the bedside Disposition Plan:  Depending on physical therapy evaluation, may need snf.    Consultants:   Orthopedics  Procedures:     Antimicrobials:       Subjective: Patient is feeling well, no pain, no dyspnea, no nausea or vomiting.    Objective: Vitals:   05/16/17 0730 05/16/17 1415 05/16/17 1445 05/16/17 1515  BP: 124/71 97/78    Pulse: 75 86 85 83  Resp: 15 20 17 18   Temp:  (!) 97.3 F (36.3 C)    TempSrc:      SpO2: 95% 93% 91% 96%  Weight:      Height:        Intake/Output Summary (Last 24 hours) at 05/16/2017 1534 Last data filed at 05/16/2017 1416 Gross per 24 hour  Intake 1550 ml  Output 100 ml  Net 1450 ml   Filed Weights   05/16/17 0129  Weight: 59 kg (130 lb)    Examination:   General: Not in pain or dyspnea. Neurology: Awake and alert, non focal  E ENT: no pallor, no icterus, oral mucosa moist Cardiovascular: No JVD. S1-S2 present, rhythmic, no gallops, rubs, or murmurs. No lower extremity edema. Pulmonary: vesicular breath sounds bilaterally, adequate air movement, no wheezing, rhonchi or rales. Gastrointestinal. Abdomen flat, no organomegaly, non tender, no rebound or guarding Skin. No rashes Musculoskeletal: no joint deformities     Data Reviewed: I have personally reviewed following labs and imaging studies  CBC: Recent Labs  Lab 05/16/17 0141  WBC 12.1*  NEUTROABS 10.8*  HGB 12.2  HCT 36.8  MCV 82.5  PLT 234   Basic Metabolic Panel: Recent Labs  Lab 05/16/17 0141  NA 135  K 3.7  CL 105  CO2 21*  GLUCOSE 134*  BUN 12  CREATININE 0.72  CALCIUM 9.1   GFR: Estimated Creatinine Clearance: 49.5 mL/min (by C-G formula based on SCr of 0.72 mg/dL). Liver Function Tests: No results for  input(s): AST, ALT, ALKPHOS, BILITOT, PROT, ALBUMIN in the last 168 hours. No results for input(s): LIPASE, AMYLASE in the last 168 hours. No results for input(s): AMMONIA in the last 168 hours. Coagulation Profile: Recent Labs  Lab 05/16/17 0141  INR 1.03   Cardiac Enzymes: Recent Labs  Lab 05/16/17 0509  TROPONINI <0.03   BNP (last 3 results) No results for input(s): PROBNP in the last 8760 hours. HbA1C: No results for input(s): HGBA1C in the last 72 hours. CBG: No  results for input(s): GLUCAP in the last 168 hours. Lipid Profile: No results for input(s): CHOL, HDL, LDLCALC, TRIG, CHOLHDL, LDLDIRECT in the last 72 hours. Thyroid Function Tests: No results for input(s): TSH, T4TOTAL, FREET4, T3FREE, THYROIDAB in the last 72 hours. Anemia Panel: No results for input(s): VITAMINB12, FOLATE, FERRITIN, TIBC, IRON, RETICCTPCT in the last 72 hours.    Radiology Studies: I have reviewed all of the imaging during this hospital visit personally     Scheduled Meds: . chlorhexidine  60 mL Topical Once  . povidone-iodine  2 application Topical Once   Continuous Infusions: . lactated ringers 75 mL/hr at 05/16/17 0522  . lactated ringers 10 mL/hr at 05/16/17 1018     LOS: 0 days        Katie KeensMauricio Daniel Mikaylee Arseneau, MD [facility] Pager [phone]

## 2017-05-16 NOTE — Anesthesia Postprocedure Evaluation (Signed)
Anesthesia Post Note  Patient: Ivan CroftShirley W Jent  Procedure(s) Performed: INTRAMEDULLARY (IM) NAIL INTERTROCHANTRIC (Left Hip)     Patient location during evaluation: PACU Anesthesia Type: General Level of consciousness: awake and alert Pain management: pain level controlled Vital Signs Assessment: post-procedure vital signs reviewed and stable Respiratory status: spontaneous breathing, nonlabored ventilation and respiratory function stable Cardiovascular status: blood pressure returned to baseline and stable Postop Assessment: no apparent nausea or vomiting Anesthetic complications: no    Last Vitals:  Vitals:   05/16/17 1445 05/16/17 1515  BP:    Pulse: 85 83  Resp: 17 18  Temp:    SpO2: 91% 96%    Last Pain:  Vitals:   05/16/17 1515  TempSrc:   PainSc: 0-No pain                 Lowella CurbWarren Ray Corretta Munce

## 2017-05-16 NOTE — Consult Note (Addendum)
ADDENDUM: PLEASE SEE MY SEPARATE FULL CONSULTATION NOTE  I do agree with the below and had already previously written my own note for this date. Seen with Mr. Katie Dickerson, as well.  [place], MD [facility], [facility] [phone] (p)  ________________________________________________________________________  Reason for Consult:Hip fx Referring Physician: Jerilynn Mages Arrien  Katie Dickerson is an 72 y.o. female.  HPI: Hala was getting ready for bed. She grabbed a washcloth out of the pantry and turned around. She thinks she bumped the counter or the door but in any case fell onto her left side. She denies any presyncope or syncope. She had immediate pain. Her husband helped her to bed and she tried to bear the pain but eventually couldn't and was brought to the ED for evaluation. X-rays showed a left intertroch femur fx and orthopedic surgery was consulted.  Past Medical History:  Diagnosis Date  . Anxiety     Past Surgical History:  Procedure Laterality Date  . ANTERIOR AND POSTERIOR REPAIR  04/04/2011   Procedure: ANTERIOR (CYSTOCELE) AND POSTERIOR REPAIR (RECTOCELE);  Surgeon: Gus Height, MD;  Location: [facility];  Service: Gynecology;  Laterality: N/A;  . LESION EXCISION  42yr ago  . PARTIAL HYSTERECTOMY  89yrago    Family History  Problem Relation Age of Onset  . Congestive Heart Failure Mother   . Atrial fibrillation Sister   . CAD Neg Hx     Social History:  reports that she has never smoked. She has never used smokeless tobacco. She reports that she drinks about 0.6 oz of alcohol per week. She reports that she does not use drugs.  Allergies:  Allergies  Allergen Reactions  . Morphine And Related Shortness Of Breath    Medications: I have reviewed the patient's current medications.  Results for orders placed or performed during the hospital encounter of 05/16/17 (from the past 48 hour(s))  Basic metabolic panel     Status: Abnormal    Collection Time: 05/16/17  1:41 AM  Result Value Ref Range   Sodium 135 135 - 145 mmol/L   Potassium 3.7 3.5 - 5.1 mmol/L   Chloride 105 101 - 111 mmol/L   CO2 21 (L) 22 - 32 mmol/L   Glucose, Bld 134 (H) 65 - 99 mg/dL   BUN 12 6 - 20 mg/dL   Creatinine, Ser 0.72 0.44 - 1.00 mg/dL   Calcium 9.1 8.9 - 10.3 mg/dL   GFR calc non Af Amer >60 >60 mL/min   GFR calc Af Amer >60 >60 mL/min    Comment: (NOTE) The eGFR has been calculated using the CKD EPI equation. This calculation has not been validated in all clinical situations. eGFR's persistently <60 mL/min signify possible Chronic Kidney Disease.    Anion gap 9 5 - 15    Comment: Performed at [facility] [place] [id number] WITH DIFFERENTIAL     Status: Abnormal   Collection Time: 05/16/17  1:41 AM  Result Value Ref Range   WBC 12.1 (H) 4.0 - 10.5 K/uL   RBC 4.46 3.87 - 5.11 MIL/uL   Hemoglobin 12.2 12.0 - 15.0 g/dL   HCT 36.8 36.0 - 46.0 %   MCV 82.5 78.0 - 100.0 fL   MCH 27.4 26.0 - 34.0 pg   MCHC 33.2 30.0 - 36.0 g/dL   RDW 13.2 11.5 - 15.5 %   Platelets 234 150 - 400 K/uL   Neutrophils Relative % 89 %  Neutro Abs 10.8 (H) 1.7 - 7.7 K/uL   Lymphocytes Relative 7 %   Lymphs Abs 0.8 0.7 - 4.0 K/uL   Monocytes Relative 4 %   Monocytes Absolute 0.5 0.1 - 1.0 K/uL   Eosinophils Relative 0 %   Eosinophils Absolute 0.0 0.0 - 0.7 K/uL   Basophils Relative 0 %   Basophils Absolute 0.0 0.0 - 0.1 K/uL    Comment: Performed at [facility] [address], [place], [place] [address]  Protime-INR     Status: None   Collection Time: 05/16/17  1:41 AM  Result Value Ref Range   Prothrombin Time 13.4 11.4 - 15.2 seconds   INR 1.03     Comment: Performed at [facility] [address], [place], [place] [address]  Type and screen [place]     Status: None   Collection Time: 05/16/17  2:00 AM  Result Value Ref Range   ABO/RH(D) O POS    Antibody Screen NEG     Sample Expiration      05/19/2017 Performed at [facility], [place] [address], [place], [place] [address]   ABO/Rh     Status: None   Collection Time: 05/16/17  2:00 AM  Result Value Ref Range   ABO/RH(D)      O POS Performed at [facility] [address] [place], [place] [address]   Troponin I     Status: None   Collection Time: 05/16/17  5:09 AM  Result Value Ref Range   Troponin I <0.03 <0.03 ng/mL    Comment: Performed at [facility] [address], [place],  [address]    Dg Chest 1 View  Result Date: 05/16/2017 CLINICAL DATA:  72 year old female status post fall in bathroom tonight with left hip fracture. EXAM: CHEST  1 VIEW COMPARISON:  Chest radiographs 08/09/2016. FINDINGS: Lower lung volumes. Allowing for portable technique the lungs are clear. Mediastinal contours remain within normal limits. Visualized tracheal air column is within normal limits. Negative visible bowel gas pattern. IMPRESSION: No acute cardiopulmonary abnormality. Electronically Signed   By: Genevie Ann M.D.   On: 05/16/2017 02:54   Dg Hip Unilat With Pelvis 2-3 Views Left  Result Date: 05/16/2017 CLINICAL DATA:  72 year old female status post fall in bathroom tonight with left hip pain. EXAM: DG HIP (WITH OR WITHOUT PELVIS) 2-3V LEFT COMPARISON:  CT Abdomen and Pelvis 02/16/2013. FINDINGS: Comminuted, impacted, and displaced proximal left femur intertrochanteric fracture. There is 1 full shaft width medial displacement of the distal fragment. There is mild posterior angulation. The left femoral head remains normally located. The pelvis appears intact. The proximal right femur appears intact. Probable rounded calcified injection granuloma projecting over the right greater trochanter. Negative visible bowel gas pattern. IMPRESSION: Comminuted left femur intertrochanteric fracture with varus impaction and 1 full shaft width medial displacement. Electronically Signed   By: Genevie Ann M.D.   On:  05/16/2017 02:53    Review of Systems  Constitutional: Negative for weight loss.  HENT: Negative for ear discharge, ear pain, hearing loss and tinnitus.   Eyes: Negative for blurred vision, double vision, photophobia and pain.  Respiratory: Negative for cough, sputum production and shortness of breath.   Cardiovascular: Negative for chest pain.  Gastrointestinal: Negative for abdominal pain, nausea and vomiting.  Genitourinary: Negative for dysuria, flank pain, frequency and urgency.  Musculoskeletal: Positive for joint pain (Left hip). Negative for back pain, falls,  myalgias and neck pain.  Neurological: Negative for dizziness, tingling, sensory change, focal weakness, loss of consciousness and headaches.  Endo/Heme/Allergies: Does not bruise/bleed easily.  Psychiatric/Behavioral: Negative for depression, memory loss and substance abuse. The patient is not nervous/anxious.    Blood pressure 124/71, pulse 75, temperature 98.4 F (36.9 C), temperature source Oral, resp. rate 15, height _0  (1.575 m), weight 59 kg (130 lb), SpO2 95 %. Physical Exam  Constitutional: She appears well-developed and well-nourished. No distress.  HENT:  Head: Normocephalic and atraumatic.  Eyes: Conjunctivae are normal. Right eye exhibits no discharge. Left eye exhibits no discharge. No scleral icterus.  Neck: Normal range of motion.  Cardiovascular: Normal rate and regular rhythm.  Respiratory: Effort normal. No respiratory distress.  Musculoskeletal:  LLE No traumatic wounds, ecchymosis, or rash  TTP left hip  No knee or ankle effusion  Knee stable to varus/ valgus and anterior/posterior stress  Sens DPN, SPN, TN intact  Motor EHL, ext, flex, evers 5/5  DP 2+, PT 1+, No significant edema  Neurological: She is alert.  Skin: Skin is warm and dry. She is not diaphoretic.  Psychiatric: She has a normal mood and affect. Her behavior is normal.    Assessment/Plan: Fall Left intertroch femur fx -- For  IMN by Dr. Marcelino Scot today. Please keep NPO. [place], PA-C Orthopedic Surgery [phone] 05/16/2017, 9:27 AM

## 2017-05-16 NOTE — ED Notes (Signed)
Informed consent signed 

## 2017-05-16 NOTE — Progress Notes (Signed)
  Full consult to follow Evaluation with Charma IgoMichael Jeffery, PA-C, underway. Plan for hip fracture pathway Will proceed with IMN of left hip if cleared as anticipated. Surgery later this am.  Myrene GalasMichael Cloa [place], MD [facility] [phone] (p)

## 2017-05-16 NOTE — ED Provider Notes (Signed)
[facility] Provider Note   CSN: [id number] Arrival date & time: 05/16/17  0117     History   Chief Complaint Chief Complaint  Patient presents with  . Fall    HPI Katie Dickerson is a 72 y.o. female.  HPI 72 year old female comes in with chief complaint of fall. Patient has history of osteoporosis, otherwise she does not have significant medical history.  Patient states that she got out of her bed and after pivoting her leg gave out and she fell.  Patient is not sure why her leg out.  Patient denies any numbness, tingling.  She is having pain in her left hip and was unable to ambulate after the fall.  Past Medical History:  Diagnosis Date  . Anxiety     Patient Active Problem List   Diagnosis Date Noted  . Closed left hip fracture, initial encounter (HCC) 05/16/2017  . Hip fracture (HCC) 05/16/2017  . Closed left subtrochanteric femur fracture, initial encounter (HCC) 05/16/2017  . Dyspnea 04/01/2015  . Abnormal CT scan, chest 04/01/2015    Past Surgical History:  Procedure Laterality Date  . ANTERIOR AND POSTERIOR REPAIR  04/04/2011   Procedure: ANTERIOR (CYSTOCELE) AND POSTERIOR REPAIR (RECTOCELE);  Surgeon: Miguel Aschoff, MD;  Location: [facility];  Service: Gynecology;  Laterality: N/A;  . LESION EXCISION  52yrs ago  . PARTIAL HYSTERECTOMY  64yrs ago     OB History   None      Home Medications    Prior to Admission medications   Medication Sig Start Date End Date Taking? Authorizing Provider  albuterol (PROAIR HFA) 108 (90 Base) MCG/ACT inhaler 2 puffs every 4 hours as needed only  if your can't catch your breath 03/31/15  Yes Nyoka Cowden, MD  ALPRAZolam Prudy Feeler) 1 MG tablet Take 0.5 mg by mouth at bedtime as needed for anxiety or sleep.   Yes [provider]  cholecalciferol (VITAMIN D) 1000 units tablet Take 1,000 Units by mouth daily.   Yes [provider]  Coenzyme Q10 (COQ10 PO) Take 1 tablet by  mouth daily.   Yes [provider]  DULoxetine (CYMBALTA) 60 MG capsule Take 60 mg by mouth daily.   Yes [provider]  estrogens, conjugated, (PREMARIN) 0.45 MG tablet Take 0.45 mg by mouth daily. Take daily for 21 days then do not take for 7 days.   Yes [provider]  PRESCRIPTION MEDICATION Place 1 drop into the right eye 2 (two) times daily. Combo eye drop from eye doc   Yes [provider]  vitamin C (ASCORBIC ACID) 500 MG tablet Take 500 mg by mouth daily.   Yes [provider]  VITAMIN E PO Take 1 tablet by mouth daily.   Yes [provider]    Family History Family History  Problem Relation Age of Onset  . Congestive Heart Failure Mother   . Atrial fibrillation Sister   . CAD Neg Hx     Social History Social History   Tobacco Use  . Smoking status: Never Smoker  . Smokeless tobacco: Never Used  Substance Use Topics  . Alcohol use: Yes    Alcohol/week: 0.6 oz    Types: 1 Glasses of wine per week    Comment: occasionally  . Drug use: No     Allergies   Morphine and related   Review of Systems Review of Systems  Constitutional: Positive for activity change.  Respiratory: Negative for shortness  of breath.   Cardiovascular: Negative for chest pain.  Gastrointestinal: Negative for anal bleeding.  Musculoskeletal: Positive for arthralgias and myalgias.  Skin: Negative for wound.  Neurological: Negative for syncope and headaches.  Hematological: Does not bruise/bleed easily.  All other systems reviewed and are negative.    Physical Exam Updated Vital Signs BP 106/60 (BP Location: Left Arm)   Pulse 95   Temp 98.1 F (36.7 C) (Oral)   Resp 18   Ht 5\' 2"  (1.575 m)   Wt 59 kg (130 lb)   SpO2 96%   BMI 23.78 kg/m   Physical Exam  Constitutional: She is oriented to person, place, and time. She appears well-developed.  HENT:  Head: Atraumatic.  Eyes: EOM are normal.  Neck: Neck supple.  No midline  c-spine tenderness, pt able to turn head to 45 degrees bilaterally without any pain and able to flex neck to the chest and extend without any pain or neurologic symptoms.   Cardiovascular: Normal rate and intact distal pulses.  Pulmonary/Chest: Effort normal.  Abdominal: Bowel sounds are normal.  Musculoskeletal:  Patient has significant edema over the left proximal thigh and tenderness to palpation. Left lower extremity seems slightly shorter, and there is minimal external rotation.  Neurological: She is alert and oriented to person, place, and time.  Skin: Skin is warm and dry.  Nursing note and vitals reviewed.    ED Treatments / Results  Labs (all labs ordered are listed, but only abnormal results are displayed) Labs Reviewed  BASIC METABOLIC PANEL - Abnormal; Notable for the following components:      Result Value   CO2 21 (*)    Glucose, Bld 134 (*)    All other components within normal limits  CBC WITH DIFFERENTIAL/PLATELET - Abnormal; Notable for the following components:   WBC 12.1 (*)    Neutro Abs 10.8 (*)    All other components within normal limits  PROTIME-INR  TROPONIN I  CBC WITH DIFFERENTIAL/PLATELET  BASIC METABOLIC PANEL  TYPE AND SCREEN  ABO/RH    EKG EKG Interpretation  Date/Time:  Friday May 16 2017 01:35:38 EDT Ventricular Rate:  75 PR Interval:    QRS Duration: 104 QT Interval:  394 QTC Calculation: 441 R Axis:   -54 Text Interpretation:  Sinus rhythm Inferior infarct, old Consider anterior infarct No acute changes Nonspecific ST and T wave abnormality Confirmed by Derwood KaplanNanavati, Lyal Husted [phone]([id number]) on 05/16/2017 2:19:36 AM Also confirmed by Derwood KaplanNanavati, Linday Rhodes [phone]([id number]), editor Elita QuickWatlington, [place] ([id number])  on 05/16/2017 6:51:07 AM   Radiology Dg Chest 1 View  Result Date: 05/16/2017 CLINICAL DATA:  72 year old female status post fall in bathroom tonight with left hip fracture. EXAM: CHEST  1 VIEW COMPARISON:  Chest radiographs 08/09/2016. FINDINGS: Lower  lung volumes. Allowing for portable technique the lungs are clear. Mediastinal contours remain within normal limits. Visualized tracheal air column is within normal limits. Negative visible bowel gas pattern. IMPRESSION: No acute cardiopulmonary abnormality. Electronically Signed   By: Odessa FlemingH  Hall M.D.   On: 05/16/2017 02:54   Dg C-arm 1-60 Min  Result Date: 05/16/2017 CLINICAL DATA:  Intraoperative fluoroscopy. EXAM: DG C-ARM 61-120 MIN; LEFT FEMUR 2 VIEWS COMPARISON:  None. FINDINGS: A series of intraoperative fluoroscopic images are provided showing intramedullary rod fixation of the left femur, with dynamic fixation screw traversing the left femoral neck. Hardware appears intact and appropriately positioned. Osseous alignment is anatomic. Fluoroscopy was provided for 91 seconds IMPRESSION: Intraoperative fluoroscopic images demonstrating fixation of the left femoral neck  fracture. No evidence of surgical complicating feature. Electronically Signed   By: Bary Richard M.D.   On: 05/16/2017 14:04   Dg C-arm 1-60 Min  Result Date: 05/16/2017 CLINICAL DATA:  Intraoperative fluoroscopy. EXAM: DG C-ARM 61-120 MIN; LEFT FEMUR 2 VIEWS COMPARISON:  None. FINDINGS: A series of intraoperative fluoroscopic images are provided showing intramedullary rod fixation of the left femur, with dynamic fixation screw traversing the left femoral neck. Hardware appears intact and appropriately positioned. Osseous alignment is anatomic. Fluoroscopy was provided for 91 seconds IMPRESSION: Intraoperative fluoroscopic images demonstrating fixation of the left femoral neck fracture. No evidence of surgical complicating feature. Electronically Signed   By: Bary Richard M.D.   On: 05/16/2017 14:04   Dg Hip Unilat With Pelvis 2-3 Views Left  Result Date: 05/16/2017 CLINICAL DATA:  72 year old female status post fall in bathroom tonight with left hip pain. EXAM: DG HIP (WITH OR WITHOUT PELVIS) 2-3V LEFT COMPARISON:  CT Abdomen and  Pelvis 02/16/2013. FINDINGS: Comminuted, impacted, and displaced proximal left femur intertrochanteric fracture. There is 1 full shaft width medial displacement of the distal fragment. There is mild posterior angulation. The left femoral head remains normally located. The pelvis appears intact. The proximal right femur appears intact. Probable rounded calcified injection granuloma projecting over the right greater trochanter. Negative visible bowel gas pattern. IMPRESSION: Comminuted left femur intertrochanteric fracture with varus impaction and 1 full shaft width medial displacement. Electronically Signed   By: Odessa Fleming M.D.   On: 05/16/2017 02:53   Dg Femur Min 2 Views Left  Result Date: 05/16/2017 CLINICAL DATA:  Intraoperative fluoroscopy. EXAM: DG C-ARM 61-120 MIN; LEFT FEMUR 2 VIEWS COMPARISON:  None. FINDINGS: A series of intraoperative fluoroscopic images are provided showing intramedullary rod fixation of the left femur, with dynamic fixation screw traversing the left femoral neck. Hardware appears intact and appropriately positioned. Osseous alignment is anatomic. Fluoroscopy was provided for 91 seconds IMPRESSION: Intraoperative fluoroscopic images demonstrating fixation of the left femoral neck fracture. No evidence of surgical complicating feature. Electronically Signed   By: Bary Richard M.D.   On: 05/16/2017 14:04   Dg Femur Port Min 2 Views Left  Result Date: 05/16/2017 CLINICAL DATA:  Closed left subtrochanteric femur fracture, initial encounter. EXAM: LEFT FEMUR PORTABLE 2 VIEWS COMPARISON:  Intraoperative radiographs from the same day. FINDINGS: The comminuted intertrochanteric fracture has been reduced. With intramedullary rod is in place with 2 distal interlocking screws. The proximal dynamic screw traverses the femoral neck. Pelvis is intact.  Fragments are noted anterior on the lateral view. IMPRESSION: ORIF of comminuted intertrochanteric fracture of the left hip without radiographic  evidence for complication. Electronically Signed   By: Marin Roberts M.D.   On: 05/16/2017 17:28    Procedures Procedures (including critical care time)  Medications Ordered in ED Medications  HYDROmorphone (DILAUDID) injection 0.5 mg (0.5 mg Intravenous Given by Other 05/16/17 2110)  lactated ringers infusion ( Intravenous Rate/Dose Change 05/16/17 0522)  lactated ringers infusion ( Intravenous Anesthesia Volume Adjustment 05/16/17 1416)  docusate sodium (COLACE) capsule 100 mg (100 mg Oral Given by Other 05/16/17 2111)  ondansetron (ZOFRAN) tablet 4 mg (has no administration in time range)    Or  ondansetron (ZOFRAN) injection 4 mg (has no administration in time range)  metoCLOPramide (REGLAN) tablet 5-10 mg (has no administration in time range)    Or  metoCLOPramide (REGLAN) injection 5-10 mg (has no administration in time range)  enoxaparin (LOVENOX) injection 40 mg (has no administration in time range)  acetaminophen (TYLENOL) tablet 325-650 mg (has no administration in time range)  traMADol (ULTRAM) tablet 50 mg (50 mg Oral Given 05/16/17 1841)  methocarbamol (ROBAXIN) tablet 500 mg (500 mg Oral Given by Other 05/16/17 2111)    Or  methocarbamol (ROBAXIN) 500 mg in dextrose 5 % 50 mL IVPB ( Intravenous See Alternative 05/16/17 2111)  diphenhydrAMINE (BENADRYL) 12.5 MG/5ML elixir 12.5-25 mg (has no administration in time range)  pantoprazole (PROTONIX) EC tablet 40 mg (has no administration in time range)  senna-docusate (Senokot-S) tablet 1 tablet (has no administration in time range)  bisacodyl (DULCOLAX) EC tablet 5 mg (has no administration in time range)  sodium phosphate (FLEET) 7-19 GM/118ML enema 1 enema (has no administration in time range)  ALPRAZolam (XANAX) tablet 0.5 mg (has no administration in time range)  DULoxetine (CYMBALTA) DR capsule 60 mg (has no administration in time range)  fentaNYL (SUBLIMAZE) injection 50 mcg (50 mcg Intravenous Given 05/16/17 0344)    ceFAZolin (ANCEF) IVPB 2g/100 mL premix (2 g Intravenous Given 05/16/17 1203)  ceFAZolin (ANCEF) 2-4 GM/100ML-% IVPB (  Override pull for Anesthesia 05/16/17 1203)     Initial Impression / Assessment and Plan / ED Course  I have reviewed the triage vital signs and the nursing notes.  Pertinent labs & imaging results that were available during my care of the patient were reviewed by me and considered in my medical decision making (see chart for details).     72 year old female comes in with chief complaint of fall DDx includes: - Mechanical falls - ICH - Fractures - Contusions - Soft tissue injury  I feel comfortable clinically clearing patient's C-spine and brain.  I am a bit concerned about potential left femoral fracture or left hip fracture.  Appropriate imaging has been ordered.  Late entry: Dr. Constance Goltz will see the patient from Ortho. NPO. Dr. Toniann Fail aware.  Final Clinical Impressions(s) / ED Diagnoses   Final diagnoses:  Closed left hip fracture, initial encounter [facility])  Elective surgery  Closed left subtrochanteric femur fracture, initial encounter [facility])    ED Discharge Orders    None       Derwood Kaplan, MD 05/16/17 2312

## 2017-05-16 NOTE — Care Management Note (Signed)
Case Management Note  Patient Details  Name: Katie Dickerson MRN: [id number] Date of Birth: August 24, 1945  Subjective/Objective:                  72 year old female comes in with chief complaint of fall. Patient has history of osteoporosis, otherwise she does not have significant medical history. From home with spouse.  Action/Plan: Admit status INPATIENT (FALL WITH FRACTURE); anticipate discharge HOME WITH HOME HEALTH VS SNF.   Expected Discharge Date:  (unknown)               Expected Discharge Plan:  Home w Home Health Services  In-House Referral:  Clinical Social Work  Discharge planning Services  CM Consult  Post Acute Care Choice:    Choice offered to:     DME Arranged:    DME Agency:     HH Arranged:    HH Agency:     Status of Service:  In process, will continue to follow  If discussed at Long Length of Stay Meetings, dates discussed:    Additional Comments: PCP: Leonia ReaderGREG GRISSO Camiya Vinal, RN 05/16/2017, 9:22 AM

## 2017-05-16 NOTE — H&P (Signed)
History and Physical    Katie Dickerson:[id number] DOB: 06-Sep-1945 DOA: 05/16/2017  PCP: [name]., MD  Patient coming from: Home.  Chief Complaint: Fall.  HPI: Katie Dickerson is a 72 y.o. female with history of nonspecific arthritis and neuropathy presents to the ER with complaints of having a fall.  Patient states she was walking at home when she suddenly tripped and fell.  Denies hitting her head or losing consciousness or any chest pain or shortness of breath or palpitations.  Following the fall patient started developing pain in the left hip area.  Patient states he had abnormal EKG for which patient had stress test and 2D echo done by Dr. Dulce Sellar in hospital which as per the patient was unremarkable.  ED Course: X-rays revealed left hip fracture.  Dr. Charlann Boxer on-call orthopedic surgeon was consulted.  Patient otherwise is in not in distress denies any chest pain or shortness of breath.  Review of Systems: As per HPI, rest all negative.   Past Medical History:  Diagnosis Date  . Anxiety     Past Surgical History:  Procedure Laterality Date  . ANTERIOR AND POSTERIOR REPAIR  04/04/2011   Procedure: ANTERIOR (CYSTOCELE) AND POSTERIOR REPAIR (RECTOCELE);  Surgeon: Miguel Aschoff, MD;  Location: [facility];  Service: Gynecology;  Laterality: N/A;  . LESION EXCISION  73yrs ago  . PARTIAL HYSTERECTOMY  40yrs ago     reports that she has never smoked. She has never used smokeless tobacco. She reports that she drinks about 0.6 oz of alcohol per week. She reports that she does not use drugs.  No Known Allergies  Family History  Problem Relation Age of Onset  . Congestive Heart Failure Mother   . Atrial fibrillation Sister   . CAD Neg Hx     Prior to Admission medications   Medication Sig Start Date End Date Taking? Authorizing Provider  albuterol (PROAIR HFA) 108 (90 Base) MCG/ACT inhaler 2 puffs every 4 hours as needed only  if your can't catch your breath 03/31/15   Nyoka Cowden, MD  Fluticasone Furoate-Vilanterol (BREO ELLIPTA) 200-25 MCG/INH AEPB Inhale 1 puff into the lungs daily.    [provider]  gabapentin (NEURONTIN) 300 MG capsule Take 300 mg by mouth 3 (three) times daily.    [provider]  omeprazole (PRILOSEC) 20 MG capsule Take 20 mg by mouth daily.    [provider]    Physical Exam: Vitals:   05/16/17 0200 05/16/17 0230 05/16/17 0300 05/16/17 0330  BP: 114/63 (!) 112/91 125/79 131/71  Pulse: 67 74 71 73  Resp: 12 (!) 9 (!) 21 20  Temp:      TempSrc:      SpO2: 96% 95% 94% 95%  Weight:      Height:          Constitutional: Moderately built and nourished. Vitals:   05/16/17 0200 05/16/17 0230 05/16/17 0300 05/16/17 0330  BP: 114/63 (!) 112/91 125/79 131/71  Pulse: 67 74 71 73  Resp: 12 (!) 9 (!) 21 20  Temp:      TempSrc:      SpO2: 96% 95% 94% 95%  Weight:      Height:       Eyes: Anicteric no pallor. ENMT: No discharge from the ears eyes nose or mouth. Neck: No mass felt.  No JVD appreciated. Respiratory: No rhonchi or crepitations. Cardiovascular: S1-S2 heard no murmurs appreciated. Abdomen: Soft nontender bowel sounds present. Musculoskeletal:  No edema.  No joint effusion.  Pain on moving the left hip. Skin: No rash. Neurologic: Alert awake oriented to time place and person.  Moves all extremities. Psychiatric: Appears normal.  Normal affect.   Labs on Admission: I have personally reviewed following labs and imaging studies  CBC: Recent Labs  Lab 05/16/17 0141  WBC 12.1*  NEUTROABS 10.8*  HGB 12.2  HCT 36.8  MCV 82.5  PLT 234   Basic Metabolic Panel: Recent Labs  Lab 05/16/17 0141  NA 135  K 3.7  CL 105  CO2 21*  GLUCOSE 134*  BUN 12  CREATININE 0.72  CALCIUM 9.1   GFR: Estimated Creatinine Clearance: 49.5 mL/min (by C-G formula based on SCr of 0.72 mg/dL). Liver Function Tests: No results for input(s): AST, ALT, ALKPHOS, BILITOT, PROT, ALBUMIN in the last 168  hours. No results for input(s): LIPASE, AMYLASE in the last 168 hours. No results for input(s): AMMONIA in the last 168 hours. Coagulation Profile: Recent Labs  Lab 05/16/17 0141  INR 1.03   Cardiac Enzymes: No results for input(s): CKTOTAL, CKMB, CKMBINDEX, TROPONINI in the last 168 hours. BNP (last 3 results) No results for input(s): PROBNP in the last 8760 hours. HbA1C: No results for input(s): HGBA1C in the last 72 hours. CBG: No results for input(s): GLUCAP in the last 168 hours. Lipid Profile: No results for input(s): CHOL, HDL, LDLCALC, TRIG, CHOLHDL, LDLDIRECT in the last 72 hours. Thyroid Function Tests: No results for input(s): TSH, T4TOTAL, FREET4, T3FREE, THYROIDAB in the last 72 hours. Anemia Panel: No results for input(s): VITAMINB12, FOLATE, FERRITIN, TIBC, IRON, RETICCTPCT in the last 72 hours. Urine analysis: No results found for: COLORURINE, APPEARANCEUR, LABSPEC, PHURINE, GLUCOSEU, HGBUR, BILIRUBINUR, KETONESUR, PROTEINUR, UROBILINOGEN, NITRITE, LEUKOCYTESUR Sepsis Labs: @LABRCNTIP (procalcitonin:4,lacticidven:4) )No results found for this or any previous visit (from the past 240 hour(s)).   Radiological Exams on Admission: Dg Chest 1 View  Result Date: 05/16/2017 CLINICAL DATA:  72 year old female status post fall in bathroom tonight with left hip fracture. EXAM: CHEST  1 VIEW COMPARISON:  Chest radiographs 08/09/2016. FINDINGS: Lower lung volumes. Allowing for portable technique the lungs are clear. Mediastinal contours remain within normal limits. Visualized tracheal air column is within normal limits. Negative visible bowel gas pattern. IMPRESSION: No acute cardiopulmonary abnormality. Electronically Signed   By: Odessa Fleming M.D.   On: 05/16/2017 02:54   Dg Hip Unilat With Pelvis 2-3 Views Left  Result Date: 05/16/2017 CLINICAL DATA:  72 year old female status post fall in bathroom tonight with left hip pain. EXAM: DG HIP (WITH OR WITHOUT PELVIS) 2-3V LEFT  COMPARISON:  CT Abdomen and Pelvis 02/16/2013. FINDINGS: Comminuted, impacted, and displaced proximal left femur intertrochanteric fracture. There is 1 full shaft width medial displacement of the distal fragment. There is mild posterior angulation. The left femoral head remains normally located. The pelvis appears intact. The proximal right femur appears intact. Probable rounded calcified injection granuloma projecting over the right greater trochanter. Negative visible bowel gas pattern. IMPRESSION: Comminuted left femur intertrochanteric fracture with varus impaction and 1 full shaft width medial displacement. Electronically Signed   By: Odessa Fleming M.D.   On: 05/16/2017 02:53    EKG: Independently reviewed.  Normal sinus rhythm with nonspecific T wave changes.  Old inferior infarct.  Assessment/Plan Principal Problem:   Closed left hip fracture, initial encounter [facility]) Active Problems:   Hip fracture (HCC)    1. Left hip fracture status post mechanical fall -patient will be kept n.p.o. in anticipation of  surgery.  Dilaudid for pain relief.  Dr. Charlann Boxerlin on-call orthopedic surgeon to see patient and likely will have surgery today.  Patient is at moderate risk for intermediate risk procedure. 2. History of nonspecific arthritis presently not on any medication being followed by rheumatologist. 3. History of abnormal EKG has had normal stress test and echocardiogram [place]. 4. History of neuropathy.   DVT prophylaxis: SCDs. Code Status: Full code. Family Communication: Discussed with patient's husband at the bedside. Disposition Plan: Likely rehab. Consults called: Orthopedics. Admission status: Inpatient.   Eduard ClosArshad N Jenina Moening MD [facility] Pager [phone].  If 7PM-7AM, please contact night-coverage [email or web address] Password [email or web address]  05/16/2017, 4:33 AM

## 2017-05-16 NOTE — OR Nursing (Signed)
Upper dentures to PACU with patient - given to PACU nurse

## 2017-05-16 NOTE — OR Nursing (Signed)
Upper denture removed intra-operatively - placed in designated container with patient label

## 2017-05-17 DIAGNOSIS — S7222XA Displaced subtrochanteric fracture of left femur, initial encounter for closed fracture: Principal | ICD-10-CM

## 2017-05-17 DIAGNOSIS — Z419 Encounter for procedure for purposes other than remedying health state, unspecified: Secondary | ICD-10-CM

## 2017-05-17 LAB — CBC WITH DIFFERENTIAL/PLATELET
BASOS ABS: 0 10*3/uL (ref 0.0–0.1)
BASOS PCT: 0 %
Eosinophils Absolute: 0 10*3/uL (ref 0.0–0.7)
Eosinophils Relative: 0 %
HEMATOCRIT: 28.4 % — AB (ref 36.0–46.0)
HEMOGLOBIN: 9.2 g/dL — AB (ref 12.0–15.0)
Lymphocytes Relative: 13 %
Lymphs Abs: 1.3 10*3/uL (ref 0.7–4.0)
MCH: 27.2 pg (ref 26.0–34.0)
MCHC: 32.4 g/dL (ref 30.0–36.0)
MCV: 84 fL (ref 78.0–100.0)
Monocytes Absolute: 1.3 10*3/uL — ABNORMAL HIGH (ref 0.1–1.0)
Monocytes Relative: 13 %
NEUTROS ABS: 7.4 10*3/uL (ref 1.7–7.7)
NEUTROS PCT: 74 %
Platelets: 203 10*3/uL (ref 150–400)
RBC: 3.38 MIL/uL — ABNORMAL LOW (ref 3.87–5.11)
RDW: 13.5 % (ref 11.5–15.5)
WBC: 10 10*3/uL (ref 4.0–10.5)

## 2017-05-17 LAB — BASIC METABOLIC PANEL
ANION GAP: 7 (ref 5–15)
BUN: 13 mg/dL (ref 6–20)
CALCIUM: 9 mg/dL (ref 8.9–10.3)
CHLORIDE: 103 mmol/L (ref 101–111)
CO2: 25 mmol/L (ref 22–32)
Creatinine, Ser: 0.76 mg/dL (ref 0.44–1.00)
GFR calc non Af Amer: >60 mL/min (ref >60)
Glucose, Bld: 152 mg/dL — ABNORMAL HIGH (ref 65–99)
Potassium: 4 mmol/L (ref 3.5–5.1)
SODIUM: 135 mmol/L (ref 135–145)

## 2017-05-17 MED ORDER — HYDROCODONE-ACETAMINOPHEN 5-325 MG PO TABS
1.0000 | ORAL_TABLET | ORAL | Status: DC | PRN
  Administered 2017-05-17 – 2017-05-20 (×11): 1 via ORAL
  Filled 2017-05-17 (×12): qty 1

## 2017-05-17 NOTE — Progress Notes (Signed)
Nutrition Brief Note  Hip fracture protocol consult received for pt.  Plan for d/c home tomorrow.   Spoke with pt and her sister Banker(RN) at bedside. They report that pt has a good appetite with no recent weight changes. Current weight is stated.   Wt Readings from Last 15 Encounters:  05/16/17 130 lb (59 kg)  03/31/15 149 lb (67.6 kg)  04/05/11 145 lb (65.8 kg)  04/02/11 145 lb (65.8 kg)    Body mass index is 23.78 kg/m.    Current diet order is Regular, patient is consuming approximately >50% of meals at this time. She did not eat roast beef last night due to dislike and did not eat pancakes this am as they were cold. Labs and medications reviewed.   No nutrition interventions warranted at this time. If nutrition issues arise, please consult RD.   Kendell BaneHeather Yamel Bale RD, LDN, CNSC [phone] Pager [phone] After Hours Pager

## 2017-05-17 NOTE — Progress Notes (Signed)
PROGRESS NOTE    Katie Dickerson  WUJ:[id number] DOB: 11-28-1945 DOA: 05/16/2017 PCP: Katie Payment., MD    Brief Narrative:  72 year old female who presented after a fall. She does have a significant past medical history for nonspecific arthritis, and neuropathy. She experienced a mechanical fall, no head trauma or loss of consciousness. Following the fall she had severe left hip pain, unable to ablate. On initial physical examination blood pressure 114/63, heart rate 67, respiratory rate 12, oxygen saturation of 96%. Moist mucous membranes, lungs clear to auscultation bilaterally, heart S1 and S2 present rhythmic, abdomen soft and nontender, no lower extremity  Edema, positive pain on passive movement of the left hip. Sodium 135, potassium 3.7, chloride 105, bicarbonate 21, glucose 134, creatinine 0.72, white count 12.1, hemoglobin 12.2, hematocrit 36.8, platelets 234. Chest x-ray negative for infiltrates. EKG sinus rhythm, left axis deviation, low voltage, normal intervals.   Patient was admitted to the hospital with working diagnosis of left hip fracture  Assessment & Plan:   Principal Problem:   Closed left hip fracture, initial encounter [facility]) Active Problems:   Hip fracture (HCC)   Closed left subtrochanteric femur fracture, initial encounter (HCC)   1. Left hip fracture. Will add hydrocodone and muscle relaxants for pain control, continue physical therapy and dvt prophylaxis, follow on orthopedic surgery recommendations.   2. Arthritis. Out of bed as tolerated, continue analgesics.   3. Neuropathy. Continue medical management   4. Anxiety. On alprazolam, and duloxetine. no confusion or agitation.    DVT prophylaxis: enoxaparin   Code Status:  full Family Communication: no family at the bedside Disposition Plan:  Depending on physical therapy evaluation, may need snf.    Consultants:   Orthopedics  Procedures:     Antimicrobials  Subjective: Patient  with positive back pain and left hip pain, moderate to severe in intensity, worse with movement, no associated nausea or vomiting, improved with analgesics.   Objective: Vitals:   05/16/17 2013 05/17/17 0011 05/17/17 0456 05/17/17 0730  BP: 106/60 126/71 (!) 107/56 112/68  Pulse: 95 95 93 76  Resp: 18 18 18 16   Temp: 98.1 F (36.7 C) 97.8 F (36.6 C) 98.3 F (36.8 C) 98.6 F (37 C)  TempSrc: Oral Oral Oral Oral  SpO2: 96% 98% 96% 96%  Weight:      Height:        Intake/Output Summary (Last 24 hours) at 05/17/2017 1244 Last data filed at 05/17/2017 0500 Gross per 24 hour  Intake 1457.33 ml  Output 100 ml  Net 1357.33 ml   Filed Weights   05/16/17 0129  Weight: 59 kg (130 lb)    Examination:   General: Not in pain or dyspnea, deconditioned Neurology: Awake and alert, non focal  E ENT: mild pallor, no icterus, oral mucosa moist Cardiovascular: No JVD. S1-S2 present, rhythmic, no gallops, rubs, or murmurs. No lower extremity edema. Pulmonary: vesicular breath sounds bilaterally, adequate air movement, no wheezing, rhonchi or rales. Gastrointestinal. Abdomen flat, no organomegaly, non tender, no rebound or guarding Skin. No rashes Musculoskeletal: no joint deformities     Data Reviewed: I have personally reviewed following labs and imaging studies  CBC: Recent Labs  Lab 05/16/17 0141 05/17/17 0640  WBC 12.1* 10.0  NEUTROABS 10.8* 7.4  HGB 12.2 9.2*  HCT 36.8 28.4*  MCV 82.5 84.0  PLT 234 203   Basic Metabolic Panel: Recent Labs  Lab 05/16/17 0141 05/17/17 0640  NA 135 135  K 3.7 4.0  CL  105 103  CO2 21* 25  GLUCOSE 134* 152*  BUN 12 13  CREATININE 0.72 0.76  CALCIUM 9.1 9.0   GFR: Estimated Creatinine Clearance: 49.5 mL/min (by C-G formula based on SCr of 0.76 mg/dL). Liver Function Tests: No results for input(s): AST, ALT, ALKPHOS, BILITOT, PROT, ALBUMIN in the last 168 hours. No results for input(s): LIPASE, AMYLASE in the last 168 hours. No  results for input(s): AMMONIA in the last 168 hours. Coagulation Profile: Recent Labs  Lab 05/16/17 0141  INR 1.03   Cardiac Enzymes: Recent Labs  Lab 05/16/17 0509  TROPONINI <0.03   BNP (last 3 results) No results for input(s): PROBNP in the last 8760 hours. HbA1C: No results for input(s): HGBA1C in the last 72 hours. CBG: No results for input(s): GLUCAP in the last 168 hours. Lipid Profile: No results for input(s): CHOL, HDL, LDLCALC, TRIG, CHOLHDL, LDLDIRECT in the last 72 hours. Thyroid Function Tests: No results for input(s): TSH, T4TOTAL, FREET4, T3FREE, THYROIDAB in the last 72 hours. Anemia Panel: No results for input(s): VITAMINB12, FOLATE, FERRITIN, TIBC, IRON, RETICCTPCT in the last 72 hours.    Radiology Studies: I have reviewed all of the imaging during this hospital visit personally     Scheduled Meds: . docusate sodium  100 mg Oral BID  . DULoxetine  60 mg Oral Daily  . enoxaparin (LOVENOX) injection  40 mg Subcutaneous Q24H  . pantoprazole  40 mg Oral Daily   Continuous Infusions: . lactated ringers 10 mL/hr at 05/16/17 1018  . methocarbamol (ROBAXIN)  IV       LOS: 1 day        Coralie KeensMauricio Daniel Arrien, MD [facility] Pager [phone]

## 2017-05-17 NOTE — Evaluation (Addendum)
Occupational Therapy Evaluation Patient Details Name: Katie CroftShirley W Dickerson MRN: [id number] DOB: Sep 26, 1945 Today's Date: 05/17/2017    History of Present Illness 72 year old female who presented after a fall. She does have a significant past medical history for nonspecific arthritis, A-fib, CHF, anxiety, and neuropathy. She experienced a mechanical fall, no head trauma or loss of consciousness. Sustained left subtrochanteric femur fracture. S/p left IM nail placement on 3/29 and WBAT.   Clinical Impression   PTA, pt was living with her husband and was independent. Pt currently requiring Mod-Max A for LB ADLs and Min-Mod A for functional transfers with RW. Pt requiring increased cues throughout session for safe sequencing of ADLs and transfers. Discussing with pt and family that they will need initital 24/7 supervision/assistance at dc due to fall risks. Pt would benefit from further acute OT to facilitate safe dc. Recommend dc to home with HHOT for further OT to optimize safety, independence with ADLs, and return to PLOF.      Follow Up Recommendations  Home health OT;Supervision/Assistance - 24 hour    Equipment Recommendations  3 in 1 bedside commode    Recommendations for Other Services PT consult     Precautions / Restrictions Precautions Precautions: Fall Restrictions Weight Bearing Restrictions: Yes LLE Weight Bearing: Weight bearing as tolerated      Mobility Bed Mobility Overal bed mobility: Needs Assistance Bed Mobility: Supine to Sit     Supine to sit: Min assist;HOB elevated     General bed mobility comments: Min A to facilitate BLEs towards EOB. Requiring MIn A to power up trunk for sidelying with HOB elevated  Transfers Overall transfer level: Needs assistance Equipment used: Rolling walker (2 wheeled) Transfers: Sit to/from UGI CorporationStand;Stand Pivot Transfers Sit to Stand: Mod assist;From elevated surface Stand pivot transfers: Min assist       General transfer  comment: Requiring Mod A to power up into standing with Max cues for hand placement. Min A for stand pivot to recliner for stability    Balance Overall balance assessment: Needs assistance Sitting-balance support: No upper extremity supported;Feet supported Sitting balance-Leahy Scale: Fair     Standing balance support: Bilateral upper extremity supported;During functional activity Standing balance-Leahy Scale: Poor Standing balance comment: Reliant on UE support                           ADL either performed or assessed with clinical judgement   ADL Overall ADL's : Needs assistance/impaired Eating/Feeding: Set up;Sitting   Grooming: Set up;Sitting   Upper Body Bathing: Set up;Supervision/ safety;Sitting   Lower Body Bathing: Moderate assistance;Sit to/from stand   Upper Body Dressing : Set up;Supervision/safety;Sitting   Lower Body Dressing: Maximal assistance;With caregiver independent assisting;Sit to/from stand Lower Body Dressing Details (indicate cue type and reason): Husband donning socks at bed level. Pt able to adjust socks at EOB by reachign forward. Unable to reach forward enough to don socks over toes Toilet Transfer: Minimal assistance;Stand-pivot;RW(Simulated to recliner) Toilet Transfer Details (indicate cue type and reason): Min A for balance         Functional mobility during ADLs: Minimal assistance;Rolling walker General ADL Comments: Pt demonstrating decreased funcitonal performance. Pt with decreased cognition and problem solving (suspected due to medication) and reuqired increased cues throughout session.     Vision         Perception     Praxis      Pertinent Vitals/Pain Pain Assessment: Faces Faces Pain Scale: Hurts little more  Pain Location: L hip Pain Descriptors / Indicators: Constant;Discomfort;Grimacing Pain Intervention(s): Monitored during session;Limited activity within patient's tolerance;Repositioned;Ice applied      Hand Dominance Right   Extremity/Trunk Assessment Upper Extremity Assessment Upper Extremity Assessment: Overall WFL for tasks assessed   Lower Extremity Assessment Lower Extremity Assessment: Defer to PT evaluation;LLE deficits/detail LLE Deficits / Details: s/p subtrochanteric femur fracture LLE Coordination: decreased fine motor;decreased gross motor   Cervical / Trunk Assessment Cervical / Trunk Assessment: Normal   Communication Communication Communication: No difficulties   Cognition Arousal/Alertness: Awake/alert;Suspect due to medications Behavior During Therapy: [facility] for tasks assessed/performed Overall Cognitive Status: Impaired/Different from baseline Area of Impairment: Following commands;Problem solving                       Following Commands: Follows one step commands inconsistently;Follows one step commands with increased time     Problem Solving: Slow processing;Requires verbal cues;Requires tactile cues;Difficulty sequencing General Comments: Suspect due to medication. Pt with poor following commands and difficulty maintaining/following conversation. Pt's husband confirms this is not baseline   General Comments  Husband present throughout session    Exercises Exercises: General Lower Extremity General Exercises - Lower Extremity Ankle Circles/Pumps: AROM;Both;10 reps;Seated   Shoulder Instructions      Home Living Family/patient expects to be discharged to:: Private residence Living Arrangements: Spouse/significant other;Children Available Help at Discharge: Family;Other (Comment);Available PRN/intermittently Type of Home: House Home Access: Ramped entrance     Home Layout: Multi-level;Able to live on main level with bedroom/bathroom;Other (Comment)(Handicap accessable basement with ramp)     Bathroom Shower/Tub: Producer, television/film/video: Handicapped height Bathroom Accessibility: Yes How Accessible: Accessible via  wheelchair;Accessible via walker Home Equipment: Crutches          Prior Functioning/Environment Level of Independence: Independent        Comments: ADLs and IADLs. Owns two large dogs        OT Problem List: Decreased strength;Decreased range of motion;Decreased activity tolerance;Impaired balance (sitting and/or standing);Decreased cognition;Decreased knowledge of use of DME or AE;Decreased knowledge of precautions;Pain      OT Treatment/Interventions: Self-care/ADL training;Therapeutic exercise;Energy conservation;DME and/or AE instruction;Therapeutic activities;Patient/family education    OT Goals(Current goals can be found in the care plan section) Acute Rehab OT Goals Patient Stated Goal: Stop pain OT Goal Formulation: With patient Time For Goal Achievement: 05/31/17 Potential to Achieve Goals: Good ADL Goals Pt Will Perform Lower Body Dressing: with min guard assist;sit to/from stand(with or without AE) Pt Will Transfer to Toilet: with min guard assist;ambulating;bedside commode Pt Will Perform Toileting - Clothing Manipulation and hygiene: with min guard assist;sit to/from stand Pt Will Perform Tub/Shower Transfer: Shower transfer;3 in 1;ambulating;rolling walker;with min guard assist  OT Frequency: Min 3X/week   Barriers to D/C:            Co-evaluation              AM-PAC PT "6 Clicks" Daily Activity     Outcome Measure Help from another person eating meals?: None Help from another person taking care of personal grooming?: A Little Help from another person toileting, which includes using toliet, bedpan, or urinal?: A Little Help from another person bathing (including washing, rinsing, drying)?: A Lot Help from another person to put on and taking off regular upper body clothing?: A Little Help from another person to put on and taking off regular lower body clothing?: A Lot 6 Click Score: 17   End of Session Equipment Utilized  During Treatment: Gait  belt;Rolling walker Nurse Communication: Mobility status;Precautions;Weight bearing status  Activity Tolerance: Patient tolerated treatment well;Patient limited by pain Patient left: in chair;with call bell/phone within reach;with chair alarm set;with family/visitor present  OT Visit Diagnosis: Unsteadiness on feet (R26.81);Other abnormalities of gait and mobility (R26.89);Muscle weakness (generalized) (M62.81);Other symptoms and signs involving cognitive function;Pain;History of falling (Z91.81) Pain - Right/Left: Left Pain - part of body: Hip                Time: 1610-9604 OT Time Calculation (min): 35 min Charges:  OT General Charges $OT Visit: 1 Visit OT Evaluation $OT Eval Moderate Complexity: 1 Mod OT Treatments $Self Care/Home Management : 8-22 mins G-Codes:     Kazandra Forstrom MSOT, OTR/L Acute Rehab Pager: [phone] Office: [phone]   Theodoro Grist Nasya Vincent 05/17/2017, 4:08 PM

## 2017-05-17 NOTE — Evaluation (Signed)
Physical Therapy Evaluation Patient Details Name: Katie Dickerson MRN: [id number] DOB: [birth year] Today's Date: 05/17/2017   History of Present Illness  72 year old female who presented after a fall. She does have a significant past medical history for nonspecific arthritis, A-fib, CHF, anxiety, and neuropathy. She experienced a mechanical fall, no head trauma or loss of consciousness. Sustained left subtrochanteric femur fracture. S/p left IM nail placement on 3/29 and WBAT.     Clinical Impression  Patient is s/p above surgery resulting in functional limitations due to the deficits listed below (see PT Problem List). PTA, pt indepenent with all mobility living at home with husband with ramped entrance. Husband works during day and patient is unsure at this time if she has 24 hour support, but reports she will look into it. Upon eval, patient presents with post op pain and weakness and is moving very slow and unsteady. Currently min guard to min A at times to stabilize during short distance ambulation. Discussed with patient that she needs 24/7 support at home at first, she agrees she is at increased risk of falling. Husband not present at this time to reiterate. Next PT visit will improve independence with functional transfers and ensure safety for home. If patient progresses with therapy, and husband can be educated on proper level of assistance required she could be safe to return home with HHPT, otherwise may be safer with short term SNF placement. She is agreeable to either option but prefers to go home.   Patient will benefit from skilled PT to increase their independence and safety with mobility to allow discharge to the venue listed below.       Follow Up Recommendations Supervision/Assistance - 24 hour;Home health PT    Equipment Recommendations  Rolling walker with 5" wheels;3in1 (PT)    Recommendations for Other Services       Precautions / Restrictions Precautions Precautions:  Fall Restrictions Weight Bearing Restrictions: Yes LLE Weight Bearing: Weight bearing as tolerated      Mobility  Bed Mobility Overal bed mobility: Needs Assistance Bed Mobility: Supine to Sit     Supine to sit: Min assist;HOB elevated     General bed mobility comments: OOB at entry  Transfers Overall transfer level: Needs assistance Equipment used: Rolling walker (2 wheeled) Transfers: Sit to/from [employer] Sit to Stand: Mod assist Stand pivot transfers: Min assist       General transfer comment: Min A to power up this visit, and to provide stabilization once standing. Min guard at best.  Ambulation/Gait Ambulation/Gait assistance: Min assist Ambulation Distance (Feet): 20 Feet Assistive device: Rolling walker (2 wheeled) Gait Pattern/deviations: Step-to pattern;Antalgic Gait velocity: decreased   General Gait Details: cues for sequencing. patient ambulating with shaky and antlagic, extremly slow gait. Min A at times for stabilization, min guard at min. patient unsafe at this visit to let go of and agrees she is increased risk of falling. walked to hospital room door then required seated return to room.  Stairs            Wheelchair Mobility    Modified Rankin (Stroke Patients Only)       Balance Overall balance assessment: Needs assistance Sitting-balance support: No upper extremity supported;Feet supported Sitting balance-Leahy Scale: Fair     Standing balance support: Bilateral upper extremity supported;During functional activity Standing balance-Leahy Scale: Poor Standing balance comment: Reliant on UE support  Pertinent Vitals/Pain Pain Assessment: Faces Faces Pain Scale: Hurts little more Pain Location: L hip Pain Descriptors / Indicators: Constant;Discomfort;Grimacing Pain Intervention(s): Limited activity within patient's tolerance;Monitored during session;Repositioned;Premedicated  before session    Home Living Family/patient expects to be discharged to:: Private residence Living Arrangements: Spouse/significant other;Children Available Help at Discharge: Family;Other (Comment);Available PRN/intermittently Type of Home: House Home Access: Ramped entrance     Home Layout: Multi-level;Able to live on main level with bedroom/bathroom;Other (Comment) Home Equipment: Crutches      Prior Function Level of Independence: Independent         Comments: ADLs and IADLs. Owns two large dogs     Hand Dominance   Dominant Hand: Right    Extremity/Trunk Assessment   Upper Extremity Assessment Upper Extremity Assessment: Overall WFL for tasks assessed    Lower Extremity Assessment Lower Extremity Assessment: Overall WFL for tasks assessed(LLE pain and weakness consistent with above procedure ) LLE Deficits / Details: s/p subtrochanteric femur fracture LLE Coordination: decreased fine motor;decreased gross motor    Cervical / Trunk Assessment Cervical / Trunk Assessment: Normal  Communication   Communication: No difficulties  Cognition Arousal/Alertness: Awake/alert;Suspect due to medications Behavior During Therapy: [facility] for tasks assessed/performed Overall Cognitive Status: Impaired/Different from baseline Area of Impairment: Following commands;Problem solving                       Following Commands: Follows one step commands inconsistently;Follows one step commands with increased time     Problem Solving: Slow processing;Requires verbal cues;Requires tactile cues;Difficulty sequencing General Comments: Suspect due to medication. Pt with poor following commands and difficulty maintaining/following conversation. Pt's husband confirms this is not baseline      General Comments General comments (skin integrity, edema, etc.): husband not present. extensive discussion rearding home safety and level of support needed.    Exercises General  Exercises - Lower Extremity Ankle Circles/Pumps: AROM;Both;Seated;20 reps Long Arc Quad: AROM;10 reps Hip ABduction/ADduction: AROM;10 reps   Assessment/Plan    PT Assessment Patient needs continued PT services  PT Problem List Decreased strength;Decreased range of motion;Decreased activity tolerance;Decreased balance;Decreased mobility;Decreased knowledge of use of DME;Pain       PT Treatment Interventions DME instruction;Gait training;Stair training;Functional mobility training;Therapeutic activities;Therapeutic exercise;Balance training    PT Goals (Current goals can be found in the Care Plan section)  Acute Rehab PT Goals Patient Stated Goal: go home with HHPT PT Goal Formulation: With patient Time For Goal Achievement: 05/24/17 Potential to Achieve Goals: Good    Frequency Min 5X/week   Barriers to discharge Decreased caregiver support(husband out of the home working)      Co-evaluation               AM-PAC PT "6 Clicks" Daily Activity  Outcome Measure Difficulty turning over in bed (including adjusting bedclothes, sheets and blankets)?: Unable Difficulty moving from lying on back to sitting on the side of the bed? : Unable Difficulty sitting down on and standing up from a chair with arms (e.g., wheelchair, bedside commode, etc,.)?: Unable Help needed moving to and from a bed to chair (including a wheelchair)?: A Little Help needed walking in hospital room?: A Little Help needed climbing 3-5 steps with a railing? : Total 6 Click Score: 10    End of Session Equipment Utilized During Treatment: Gait belt Activity Tolerance: Patient limited by pain;Patient tolerated treatment well;Patient limited by fatigue Patient left: in chair;with call bell/phone within reach;with nursing/sitter in room Nurse Communication: Mobility status  PT Visit Diagnosis: Unsteadiness on feet (R26.81);Other abnormalities of gait and mobility (R26.89);History of falling (Z91.81);Pain Pain -  Right/Left: Left Pain - part of body: Hip    Time: [id number] PT Time Calculation (min) (ACUTE ONLY): 29 min   Charges:   PT Evaluation $PT Eval Low Complexity: 1 Low PT Treatments $Gait Training: 8-22 mins   PT G Codes:        Etta GrandchildSean Pearlean Sabina, PT, DPT Acute Rehab Services Pager: [phone]   Etta GrandchildSean  Zykira Matlack 05/17/2017, 5:57 PM

## 2017-05-17 NOTE — Progress Notes (Signed)
Subjective: 1 Day Post-Op Procedure(s) (LRB): INTRAMEDULLARY (IM) NAIL INTERTROCHANTRIC (Left) Patient reports pain as moderate and severe.    Objective: Vital signs in last 24 hours: Temp:  [97.3 F (36.3 C)-98.6 F (37 C)] 98.6 F (37 C) (03/30 0730) Pulse Rate:  [76-95] 76 (03/30 0730) Resp:  [15-21] 16 (03/30 0730) BP: (97-134)/(56-78) 112/68 (03/30 0730) SpO2:  [91 %-98 %] 96 % (03/30 0730)  Intake/Output from previous day: 03/29 0701 - 03/30 0700 In: 2257.3 [P.O.:360; I.V.:1897.3] Out: 100 [Blood:100] Intake/Output this shift: No intake/output data recorded.  Recent Labs    05/16/17 0141 05/17/17 0640  HGB 12.2 9.2*   Recent Labs    05/16/17 0141 05/17/17 0640  WBC 12.1* 10.0  RBC 4.46 3.38*  HCT 36.8 28.4*  PLT 234 203   Recent Labs    05/16/17 0141 05/17/17 0640  NA 135 135  K 3.7 4.0  CL 105 103  CO2 21* 25  BUN 12 13  CREATININE 0.72 0.76  GLUCOSE 134* 152*  CALCIUM 9.1 9.0   Recent Labs    05/16/17 0141  INR 1.03    Neurovascular intact Sensation intact distally Intact pulses distally Dorsiflexion/Plantar flexion intact Incision: dressing C/D/I Compartment soft  Assessment/Plan: 1 Day Post-Op Procedure(s) (LRB): INTRAMEDULLARY (IM) NAIL INTERTROCHANTRIC (Left) Up with therapy  WBAT LLE Pain control as ordered lovenox dvt proph Possible d/c home tomorrow with HHPT  Margart SicklesJoshua Dolton Shaker 05/17/2017, 9:50 AM

## 2017-05-18 DIAGNOSIS — F419 Anxiety disorder, unspecified: Secondary | ICD-10-CM

## 2017-05-18 LAB — BASIC METABOLIC PANEL
ANION GAP: 6 (ref 5–15)
BUN: 13 mg/dL (ref 6–20)
CO2: 27 mmol/L (ref 22–32)
Calcium: 8.5 mg/dL — ABNORMAL LOW (ref 8.9–10.3)
Chloride: 102 mmol/L (ref 101–111)
Creatinine, Ser: 0.71 mg/dL (ref 0.44–1.00)
GFR calc Af Amer: >60 mL/min (ref >60)
GFR calc non Af Amer: >60 mL/min (ref >60)
GLUCOSE: 121 mg/dL — AB (ref 65–99)
POTASSIUM: 3.9 mmol/L (ref 3.5–5.1)
Sodium: 135 mmol/L (ref 135–145)

## 2017-05-18 MED ORDER — ENOXAPARIN SODIUM 40 MG/0.4ML ~~LOC~~ SOLN: 40.0000 mg | SUBCUTANEOUS | 0 refills | Status: DC

## 2017-05-18 MED ORDER — DOCUSATE SODIUM 100 MG PO CAPS: 100.0000 mg | ORAL_CAPSULE | Freq: Two times a day (BID) | ORAL | 0 refills | Status: AC

## 2017-05-18 MED ORDER — HYDROCODONE-ACETAMINOPHEN 5-325 MG PO TABS: 1.0000 | ORAL_TABLET | ORAL | 0 refills | Status: DC | PRN

## 2017-05-18 NOTE — Progress Notes (Addendum)
Physical Therapy Treatment Patient Details Name: ADDIS BENNIE MRN: [id number] DOB: [birth year] Today's Date: 05/18/2017    History of Present Illness 72 year old female who presented after a fall. She does have a significant past medical history for nonspecific arthritis, A-fib, CHF, anxiety, and neuropathy. She experienced a mechanical fall, no head trauma or loss of consciousness. Sustained left subtrochanteric femur fracture. S/p left IM nail placement on 3/29 and WBAT.    PT Comments    Pt demonstrates significant activity limitation from pain. Pt was able to move from supine to sitting, sitting to standing, ambulated about 10 feet and used a bedside commode before being transferred back to bed. Pt moves very slowly due to pain and possibly confusion due to medication. Easily distracted and required constant verbal and tactile cues during gait. Pt reported being tired and overwhelmed by a long day of PT and visitors and was looking forward to a nap post session. Discharge plans are still to go to SNF.    Follow Up Recommendations SNF     Equipment Recommendations  Rolling walker with 5" wheels    Recommendations for Other Services       Precautions / Restrictions Precautions Precautions: Fall Restrictions Weight Bearing Restrictions: Yes LLE Weight Bearing: Weight bearing as tolerated    Mobility  Bed Mobility Overal bed mobility: Needs Assistance Bed Mobility: Supine to Sit;Sit to Supine     Supine to sit: Mod assist Sit to supine: Mod assist   General bed mobility comments: Cues for technique; Mod assist to help LEs off of  bed, and elevate trunk to sit; Mod assist to help LEs back into bed and position optimally in bed  Transfers Overall transfer level: Needs assistance Equipment used: Rolling walker (2 wheeled) Transfers: Sit to/from Stand Sit to Stand: Mod assist         General transfer comment: Mod assist to power up. Multiple preparatory steps required  to achieve weight over feet to power up.   Ambulation/Gait Ambulation/Gait assistance: Mod assist Ambulation Distance (Feet): 10 Feet Assistive device: Rolling walker (2 wheeled) Gait Pattern/deviations: Step-to pattern;Antalgic Gait velocity: decreased Gait velocity interpretation: <1.8 ft/sec, indicative of risk for recurrent falls General Gait Details: verbal and tactile cueing to lift L toes, weightshift onto L and sequencing . slow gait. limited by pain. Mod assist for L foot advancement initially, improved with practice; Mod assist to weight shift onto L leg in stance for R foot advancement.    Stairs            Wheelchair Mobility    Modified Rankin (Stroke Patients Only)       Balance Overall balance assessment: Needs assistance Sitting-balance support: No upper extremity supported;Feet supported Sitting balance-Leahy Scale: Fair     Standing balance support: Bilateral upper extremity supported;During functional activity Standing balance-Leahy Scale: Poor Standing balance comment: Reliant on UE support                            Cognition Arousal/Alertness: Awake/alert Behavior During Therapy: WFL for tasks assessed/performed Overall Cognitive Status: Impaired/Different from baseline Area of Impairment: Attention;Following commands;Safety/judgement;Awareness;Problem solving                   Current Attention Level: Selective   Following Commands: Follows one step commands inconsistently;Follows one step commands with increased time Safety/Judgement: Decreased awareness of deficits;Decreased awareness of safety Awareness: Emergent Problem Solving: Slow processing;Requires verbal cues;Requires tactile cues;Difficulty sequencing General Comments:  Pt with poor ability to follow commands. Very easily distracted by external and internal stimuli. Suspect cognitive changes related to medications.       Exercises General Exercises - Upper  Extremity Chair Push Up: Strengthening;5 reps General Exercises - Lower Extremity Ankle Circles/Pumps: AROM;Both;20 reps    General Comments General comments (skin integrity, edema, etc.): Pt moves very slowly. Compliant, in enough pain to alter her attention      Pertinent Vitals/Pain Pain Assessment: Faces Pain Score: (could not assign a number to the pain) Faces Pain Scale: Hurts even more Pain Location: L hip Pain Descriptors / Indicators: Grimacing Pain Intervention(s): Limited activity within patient's tolerance;RN gave pain meds during session    Home Living                      Prior Function            PT Goals (current goals can now be found in the care plan section) Acute Rehab PT Goals Patient Stated Goal: go to rehab before home.  PT Goal Formulation: With patient/family Time For Goal Achievement: 05/24/17 Potential to Achieve Goals: Good Progress towards PT goals: Not progressing toward goals - comment(limited by pain and anxiety)    Frequency    Min 5X/week      PT Plan Current plan remains appropriate    Co-evaluation              AM-PAC PT "6 Clicks" Daily Activity  Outcome Measure  Difficulty turning over in bed (including adjusting bedclothes, sheets and blankets)?: Unable Difficulty moving from lying on back to sitting on the side of the bed? : Unable Difficulty sitting down on and standing up from a chair with arms (e.g., wheelchair, bedside commode, etc,.)?: Unable Help needed moving to and from a bed to chair (including a wheelchair)?: A Lot Help needed walking in hospital room?: A Lot Help needed climbing 3-5 steps with a railing? : Total 6 Click Score: 8    End of Session Equipment Utilized During Treatment: Gait belt Activity Tolerance: Patient limited by pain;Patient limited by fatigue;Patient tolerated treatment well Patient left: in bed;with call bell/phone within reach;with bed alarm set;with family/visitor  present Nurse Communication: Mobility status;Other (comment) PT Visit Diagnosis: Repeated falls (R29.6);History of falling (Z91.81);Unsteadiness on feet (R26.81);Pain;Other abnormalities of gait and mobility (R26.89) Pain - Right/Left: Left Pain - part of body: Hip     Time: [id number] PT Time Calculation (min) (ACUTE ONLY): 53 min  Charges:      05/18/17 1700  PT Time Calculation  PT Start Time (ACUTE ONLY) 1556  PT Stop Time (ACUTE ONLY) 1646  PT Time Calculation (min) (ACUTE ONLY) 50 min  PT General Charges  $$ ACUTE PT VISIT 1 Visit  PT Treatments  $Gait Training 8-22 mins  $Therapeutic Activity 23-37 mins                      G CodesClemon Chambers:       Brendan Gates, SPT Office: [phone]  Clemon ChambersBrendan Gates 05/18/2017, 5:12 PM   The licensed clinician was present and actively directing the care throughout the session at all times.  I was present during the PT session and agree with patient status and findings as outlined by Lorriane ShireBrendan, SPT.   Van ClinesHolly Aureliano Oshields, [place]PT  Acute Rehabilitation Services Pager [phone] Office [phone]

## 2017-05-18 NOTE — Care Management (Addendum)
[facility].  Pt and husband state they have changed their minds and want pt to go to [facility]. CSW, Bridgette, advised. PT states MD aware.

## 2017-05-18 NOTE — Discharge Summary (Signed)
Physician Discharge Summary  Katie Dickerson:[id number] DOB: [birth year] DOA: 05/16/2017  PCP: [name]., MD  Admit date: 05/16/2017 Discharge date: 05/18/2017  Admitted From: Home Disposition:  Home  Recommendations for Outpatient Follow-up and new medication changes:  1. Follow up with PCP in 1-week 2. Continue pain control with hydrocodone 3. Will continue dvt prophylaxis with enoxaparin.   Home Health: yes  Equipment/Devices:  walker   Discharge Condition: stable CODE STATUS: full  Diet recommendation: Regular diet  Brief/Interim Summary: 72 year old female who presented after a fall. She does have a significant past medical history for nonspecific arthritis, and neuropathy. She experienced a mechanical fall, no head trauma or loss of consciousness. Following the fall she had severe left hip pain, unable to ambulate. On the initial physical examination blood pressure 114/63, heart rate 67, respiratory rate 12, oxygen saturation of 96%. Moist mucous membranes, lungs clear to auscultation bilaterally, heart S1 and S2 present and rhythmic, abdomen soft and nontender, no lower extremity edema, positive pain on passive movement of the left hip. Sodium 135, potassium 3.7, chloride 105, bicarbonate 21, glucose 134, creatinine 0.72, white count 12.1, hemoglobin 12.2, hematocrit 36.8, platelets 234. Chest x-ray negative for infiltrates. EKG sinus rhythm, left axis deviation, low voltage, normal intervals.   Patient was admitted to the hospital with the working diagnosis of left hip fracture  1. Acute left subtrochanteric femur fracture. Patient was admitted to the medical ward, she was placed on IV analgesics and DVT prophylaxis. She was intervened on March 29 with a intramedullary nail intertrochanteric left with affixus 9x360 mm statically locked. She was seen by physical therapy, with recommendations to follow-up with home health services.   2. Arthritis and neuropathy. Continue  pain control, physical therapy.  3. Anxiety. Patient was continued on duloxetine and alprazolam. No confusion or agitation.    Discharge Diagnoses:  Principal Problem:   Closed left hip fracture, initial encounter [facility]) Active Problems:   Hip fracture (HCC)   Closed left subtrochanteric femur fracture, initial encounter [facility])    Discharge Instructions   Allergies as of 05/18/2017      Reactions   Morphine And Related Anaphylaxis, Shortness Of Breath   "Stopped my breathing"   Other Shortness Of Breath, Itching   Smoke, trees, mold, cleaning products, other environmental triggers      Medication List    STOP taking these medications   acetaminophen 650 MG CR tablet Commonly known as:  TYLENOL   CALCIUM PO   cholecalciferol 1000 units tablet Commonly known as:  VITAMIN D   estrogens (conjugated) 0.45 MG tablet Commonly known as:  PREMARIN   traMADol 50 MG tablet Commonly known as:  ULTRAM   UNABLE TO FIND   UNABLE TO FIND   VITAMIN D-1000 MAX ST 1000 units tablet Generic drug:  Cholecalciferol   Vitamin D-3 1000 units Caps     TAKE these medications   ALPRAZolam 1 MG tablet Commonly known as:  XANAX Take 0.5-1 mg by mouth at bedtime as needed for anxiety or sleep.   CENTRUM SILVER 50+WOMEN Tabs Take 1 tablet by mouth daily.   COQ10 PO Take 1 capsule by mouth daily.   docusate sodium 100 MG capsule Commonly known as:  COLACE Take 1 capsule (100 mg total) by mouth 2 (two) times daily.   DULoxetine 60 MG capsule Commonly known as:  CYMBALTA Take 60 mg by mouth daily.   enoxaparin 40 MG/0.4ML injection Commonly known as:  LOVENOX Inject 0.4 mLs (40  mg total) into the skin daily. Start taking on:  05/19/2017   HYDROcodone-acetaminophen 5-325 MG tablet Commonly known as:  NORCO/VICODIN Take 1 tablet by mouth every 4 (four) hours as needed for moderate pain.   ROLAIDS EXTRA STRENGTH 675-135 MG Chew Generic drug:  Ca Carbonate-Mag Hydroxide Chew  1-2 tablets by mouth as needed (for sour stomach).   SYSTANE ULTRA 0.4-0.3 % Soln Generic drug:  Polyethyl Glycol-Propyl Glycol Place 1-2 drops into both eyes daily as needed (for dry eyes).   vitamin C 500 MG tablet Commonly known as:  ASCORBIC ACID Take 500 mg by mouth 2 (two) times a week.   VITAMIN E PO Take 1 capsule by mouth 2 (two) times a week.            Durable Medical Equipment  (From admission, onward)        Start     Ordered   05/18/17 0000  DME Other see comment    Comments:  Shower chair   05/18/17 1044      Allergies  Allergen Reactions  . Morphine And Related Anaphylaxis and Shortness Of Breath    "Stopped my breathing"  . Other Shortness Of Breath and Itching    Smoke, trees, mold, cleaning products, other environmental triggers    Consultations:  Orthopedic surgery    Procedures/Studies: Dg Chest 1 View  Result Date: 05/16/2017 CLINICAL DATA:  72 year old female status post fall in bathroom tonight with left hip fracture. EXAM: CHEST  1 VIEW COMPARISON:  Chest radiographs 08/09/2016. FINDINGS: Lower lung volumes. Allowing for portable technique the lungs are clear. Mediastinal contours remain within normal limits. Visualized tracheal air column is within normal limits. Negative visible bowel gas pattern. IMPRESSION: No acute cardiopulmonary abnormality. Electronically Signed   By: Odessa Fleming M.D.   On: 05/16/2017 02:54   Dg C-arm 1-60 Min  Result Date: 05/16/2017 CLINICAL DATA:  Intraoperative fluoroscopy. EXAM: DG C-ARM 61-120 MIN; LEFT FEMUR 2 VIEWS COMPARISON:  None. FINDINGS: A series of intraoperative fluoroscopic images are provided showing intramedullary rod fixation of the left femur, with dynamic fixation screw traversing the left femoral neck. Hardware appears intact and appropriately positioned. Osseous alignment is anatomic. Fluoroscopy was provided for 91 seconds IMPRESSION: Intraoperative fluoroscopic images demonstrating fixation of  the left femoral neck fracture. No evidence of surgical complicating feature. Electronically Signed   By: Bary Richard M.D.   On: 05/16/2017 14:04   Dg C-arm 1-60 Min  Result Date: 05/16/2017 CLINICAL DATA:  Intraoperative fluoroscopy. EXAM: DG C-ARM 61-120 MIN; LEFT FEMUR 2 VIEWS COMPARISON:  None. FINDINGS: A series of intraoperative fluoroscopic images are provided showing intramedullary rod fixation of the left femur, with dynamic fixation screw traversing the left femoral neck. Hardware appears intact and appropriately positioned. Osseous alignment is anatomic. Fluoroscopy was provided for 91 seconds IMPRESSION: Intraoperative fluoroscopic images demonstrating fixation of the left femoral neck fracture. No evidence of surgical complicating feature. Electronically Signed   By: Bary Richard M.D.   On: 05/16/2017 14:04   Dg Hip Unilat With Pelvis 2-3 Views Left  Result Date: 05/16/2017 CLINICAL DATA:  72 year old female status post fall in bathroom tonight with left hip pain. EXAM: DG HIP (WITH OR WITHOUT PELVIS) 2-3V LEFT COMPARISON:  CT Abdomen and Pelvis 02/16/2013. FINDINGS: Comminuted, impacted, and displaced proximal left femur intertrochanteric fracture. There is 1 full shaft width medial displacement of the distal fragment. There is mild posterior angulation. The left femoral head remains normally located. The pelvis appears intact. The  proximal right femur appears intact. Probable rounded calcified injection granuloma projecting over the right greater trochanter. Negative visible bowel gas pattern. IMPRESSION: Comminuted left femur intertrochanteric fracture with varus impaction and 1 full shaft width medial displacement. Electronically Signed   By: Odessa FlemingH  Hall M.D.   On: 05/16/2017 02:53   Dg Femur Min 2 Views Left  Result Date: 05/16/2017 CLINICAL DATA:  Intraoperative fluoroscopy. EXAM: DG C-ARM 61-120 MIN; LEFT FEMUR 2 VIEWS COMPARISON:  None. FINDINGS: A series of intraoperative  fluoroscopic images are provided showing intramedullary rod fixation of the left femur, with dynamic fixation screw traversing the left femoral neck. Hardware appears intact and appropriately positioned. Osseous alignment is anatomic. Fluoroscopy was provided for 91 seconds IMPRESSION: Intraoperative fluoroscopic images demonstrating fixation of the left femoral neck fracture. No evidence of surgical complicating feature. Electronically Signed   By: Bary RichardStan  Maynard M.D.   On: 05/16/2017 14:04   Dg Femur Port Min 2 Views Left  Result Date: 05/16/2017 CLINICAL DATA:  Closed left subtrochanteric femur fracture, initial encounter. EXAM: LEFT FEMUR PORTABLE 2 VIEWS COMPARISON:  Intraoperative radiographs from the same day. FINDINGS: The comminuted intertrochanteric fracture has been reduced. With intramedullary rod is in place with 2 distal interlocking screws. The proximal dynamic screw traverses the femoral neck. Pelvis is intact.  Fragments are noted anterior on the lateral view. IMPRESSION: ORIF of comminuted intertrochanteric fracture of the left hip without radiographic evidence for complication. Electronically Signed   By: Marin Robertshristopher  Mattern M.D.   On: 05/16/2017 17:28       Subjective: Patient feeling better, pain is controlled with analgesics, out of bed with physical therapy, no nausea or vomiting,   Discharge Exam: Vitals:   05/17/17 2101 05/18/17 0520  BP: 115/63 (!) 109/54  Pulse: 94 87  Resp: 18 18  Temp: 98.8 F (37.1 C) 98.3 F (36.8 C)  SpO2: 96% 93%   Vitals:   05/17/17 0456 05/17/17 0730 05/17/17 2101 05/18/17 0520  BP: (!) 107/56 112/68 115/63 (!) 109/54  Pulse: 93 76 94 87  Resp: 18 16 18 18   Temp: 98.3 F (36.8 C) 98.6 F (37 C) 98.8 F (37.1 C) 98.3 F (36.8 C)  TempSrc: Oral Oral Oral Oral  SpO2: 96% 96% 96% 93%  Weight:      Height:        General: Not in pain or dyspnea  Neurology: Awake and alert, non focal  E ENT: no pallor, no icterus, oral mucosa  moist Cardiovascular: No JVD. S1-S2 present, rhythmic, no gallops, rubs, or murmurs. No lower extremity edema. Pulmonary: vesicular breath sounds bilaterally, adequate air movement, no wheezing, rhonchi or rales. Gastrointestinal. Abdomen flat, no organomegaly, non tender, no rebound or guarding Skin. No rashes Musculoskeletal: no joint deformities/ pain to left lower extremity movement.    The results of significant diagnostics from this hospitalization (including imaging, microbiology, ancillary and laboratory) are listed below for reference.     Microbiology: No results found for this or any previous visit (from the past 240 hour(s)).   Labs: BNP (last 3 results) No results for input(s): BNP in the last 8760 hours. Basic Metabolic Panel: Recent Labs  Lab 05/16/17 0141 05/17/17 0640 05/18/17 0440  NA 135 135 135  K 3.7 4.0 3.9  CL 105 103 102  CO2 21* 25 27  GLUCOSE 134* 152* 121*  BUN 12 13 13   CREATININE 0.72 0.76 0.71  CALCIUM 9.1 9.0 8.5*   Liver Function Tests: No results for input(s): AST, ALT, ALKPHOS,  BILITOT, PROT, ALBUMIN in the last 168 hours. No results for input(s): LIPASE, AMYLASE in the last 168 hours. No results for input(s): AMMONIA in the last 168 hours. CBC: Recent Labs  Lab 05/16/17 0141 05/17/17 0640  WBC 12.1* 10.0  NEUTROABS 10.8* 7.4  HGB 12.2 9.2*  HCT 36.8 28.4*  MCV 82.5 84.0  PLT 234 203   Cardiac Enzymes: Recent Labs  Lab 05/16/17 0509  TROPONINI <0.03   BNP: Invalid input(s): POCBNP CBG: No results for input(s): GLUCAP in the last 168 hours. D-Dimer No results for input(s): DDIMER in the last 72 hours. Hgb A1c No results for input(s): HGBA1C in the last 72 hours. Lipid Profile No results for input(s): CHOL, HDL, LDLCALC, TRIG, CHOLHDL, LDLDIRECT in the last 72 hours. Thyroid function studies No results for input(s): TSH, T4TOTAL, T3FREE, THYROIDAB in the last 72 hours.  Invalid input(s): FREET3 Anemia work up No  results for input(s): VITAMINB12, FOLATE, FERRITIN, TIBC, IRON, RETICCTPCT in the last 72 hours. Urinalysis No results found for: COLORURINE, APPEARANCEUR, LABSPEC, PHURINE, GLUCOSEU, HGBUR, BILIRUBINUR, KETONESUR, PROTEINUR, UROBILINOGEN, NITRITE, LEUKOCYTESUR Sepsis Labs Invalid input(s): PROCALCITONIN,  WBC,  LACTICIDVEN Microbiology No results found for this or any previous visit (from the past 240 hour(s)).   Time coordinating discharge: 45 minutes  SIGNED:   Coralie Keens, MD  [facility] 05/18/2017, 10:10 AM Pager [phone]  If 7PM-7AM, please contact night-coverage [email or web address] Password [email or web address]

## 2017-05-18 NOTE — Progress Notes (Signed)
Occupational Therapy Treatment Patient Details Name: Katie CroftShirley W Ybarbo MRN: [id number] DOB: 12-14-1945 Today's Date: 05/18/2017    History of present illness 72 year old female who presented after a fall. She does have a significant past medical history for nonspecific arthritis, A-fib, CHF, anxiety, and neuropathy. She experienced a mechanical fall, no head trauma or loss of consciousness. Sustained left subtrochanteric femur fracture. S/p left IM nail placement on 3/29 and WBAT.   OT comments  Pt motivated to participate with OT this session. However, she is demonstrating poor attention, awareness, and problem solving skills impacting her safety when attempting all ADL participation. Facilitated improved toilet transfer skills with preparatory activities to maximize ability to shift weight forward over BLE. Pt continues to require maximum cues to attempt but with chair push-up repetitions was better able to power up with forward weight shift. However, once in standing, pt with poor ability to sequence stepping tasks to progress with toilet transfers this session. She additionally continues to require max assist for LB dressing tasks. Due to continued functional deficits and confusion, feel the safest D/C recommendation is SNF level rehabilitation at this time. Pt and her husband are in agreement. Updated D/C recommendation. Also discussed with PT who has already contacted MD. Will continue to follow.   Follow Up Recommendations  Supervision/Assistance - 24 hour;SNF    Equipment Recommendations  Other (comment)(TBD at next venue of care)    Recommendations for Other Services PT consult    Precautions / Restrictions Precautions Precautions: Fall Restrictions Weight Bearing Restrictions: Yes LLE Weight Bearing: Weight bearing as tolerated       Mobility Bed Mobility               General bed mobility comments: OOB in recliner on my arrival.   Transfers Overall transfer level:  Needs assistance Equipment used: Rolling walker (2 wheeled) Transfers: Sit to/from Stand Sit to Stand: Mod assist         General transfer comment: Mod assist to power up. Multiple preparatory steps required to achieve weight over feet to power up.     Balance Overall balance assessment: Needs assistance Sitting-balance support: No upper extremity supported;Feet supported Sitting balance-Leahy Scale: Fair     Standing balance support: Bilateral upper extremity supported;During functional activity Standing balance-Leahy Scale: Poor Standing balance comment: Reliant on UE support                           ADL either performed or assessed with clinical judgement   ADL Overall ADL's : Needs assistance/impaired Eating/Feeding: Set up;Sitting                   Lower Body Dressing: Sit to/from stand;Maximal assistance Lower Body Dressing Details (indicate cue type and reason): Able to pull up socks at ankles but unable to reach far enough to thread over toes. Required maximum encouragement.  Toilet Transfer: Moderate assistance;RW Toilet Transfer Details (indicate cue type and reason): Only able to complete sit<> stand with marching in place this session. Required step-by-step repetitive cues to sequence stepping pattern to step in place.            General ADL Comments: Pt with poor ability to sequence tasks this session requiring significant cues. Unable to weight shift to attempt steps without assistance and cues.      Vision       Perception     Praxis      Cognition Arousal/Alertness: Awake/alert(closes eyes frequently)  Behavior During Therapy: WFL for tasks assessed/performed Overall Cognitive Status: Impaired/Different from baseline Area of Impairment: Attention;Following commands;Safety/judgement;Awareness;Problem solving                   Current Attention Level: Selective   Following Commands: Follows one step commands  inconsistently;Follows one step commands with increased time Safety/Judgement: Decreased awareness of deficits;Decreased awareness of safety Awareness: Emergent Problem Solving: Slow processing;Requires verbal cues;Requires tactile cues;Difficulty sequencing General Comments: Pt with poor ability to follow commands. Very easily distracted by external and internal stimuli. Suspect cognitive changes related to medications.         Exercises Exercises: General Upper Extremity General Exercises - Upper Extremity Chair Push Up: Strengthening;5 reps   Shoulder Instructions       General Comments Husband and sister present during session. Pt and husband agree that rehabilitation at SNF is safest recommendation.     Pertinent Vitals/ Pain       Pain Assessment: Faces Faces Pain Scale: Hurts even more Pain Location: L hip Pain Descriptors / Indicators: Grimacing Pain Intervention(s): Limited activity within patient's tolerance;Monitored during session;Repositioned  Home Living                                          Prior Functioning/Environment              Frequency  Min 3X/week        Progress Toward Goals  OT Goals(current goals can now be found in the care plan section)  Progress towards OT goals: Progressing toward goals  Acute Rehab OT Goals Patient Stated Goal: go to rehab before home.  OT Goal Formulation: With patient Time For Goal Achievement: 05/31/17 Potential to Achieve Goals: Good  Plan Discharge plan needs to be updated    Co-evaluation                 AM-PAC PT "6 Clicks" Daily Activity     Outcome Measure   Help from another person eating meals?: None Help from another person taking care of personal grooming?: A Little Help from another person toileting, which includes using toliet, bedpan, or urinal?: A Little Help from another person bathing (including washing, rinsing, drying)?: A Lot Help from another person to put  on and taking off regular upper body clothing?: A Little Help from another person to put on and taking off regular lower body clothing?: A Lot 6 Click Score: 17    End of Session Equipment Utilized During Treatment: Rolling walker  OT Visit Diagnosis: Unsteadiness on feet (R26.81);Other abnormalities of gait and mobility (R26.89);Muscle weakness (generalized) (M62.81);Other symptoms and signs involving cognitive function;Pain;History of falling (Z91.81) Pain - Right/Left: Left Pain - part of body: Hip   Activity Tolerance Patient limited by pain;Patient limited by fatigue   Patient Left in chair;with call bell/phone within reach;with chair alarm set;with family/visitor present   Nurse Communication          Time: 4098-1191 OT Time Calculation (min): 24 min  Charges: OT General Charges $OT Visit: 1 Visit OT Treatments $Self Care/Home Management : 23-37 mins  Doristine Section, MS OTR/L  Pager: [phone]    Mikaila Grunert A Idaliz Tinkle 05/18/2017, 3:38 PM

## 2017-05-18 NOTE — Progress Notes (Addendum)
Physical Therapy Treatment Patient Details Name: Katie Dickerson MRN: [id number] DOB: 07-23-1945 Today's Date: 05/18/2017    History of Present Illness 72 year old female who presented after a fall. She does have a significant past medical history for nonspecific arthritis, A-fib, CHF, anxiety, and neuropathy. She experienced a mechanical fall, no head trauma or loss of consciousness. Sustained left subtrochanteric femur fracture. S/p left IM nail placement on 3/29 and WBAT.    PT Comments    Continues to require Mod Assist for transfers and ambulation. Unsafe to walk unassisted, even for in-room distances, limited by pain and anxiety. Easily distracted, difficulty attending to tasks at hand, required maximal cueing to return to task. Husband present during session and voiced concern with being able to provide level of assistance needed for her to return home. At this point we recommend post-acute rehab to maximize independence and safety prior to discharge home. Pt willing to consider SNF for post-acute rehab. Notified Dr. Ella Jubilee via Loretha Stapler.     Follow Up Recommendations  Supervision/Assistance - 24 hour;SNF     Equipment Recommendations  Rolling walker with 5" wheels    Recommendations for Other Services       Precautions / Restrictions Precautions Precautions: Fall Restrictions Weight Bearing Restrictions: Yes LLE Weight Bearing: Weight bearing as tolerated    Mobility  Bed Mobility                  Transfers Overall transfer level: Needs assistance Equipment used: Rolling walker (2 wheeled) Transfers: Sit to/from Stand Sit to Stand: Mod assist         General transfer comment: Mod assist to stand, VC to lean forward, stand straight, and put weight through legs. Hesitant to shift weight forward in order to stand and progress limb, likely due to fear of falling.   Ambulation/Gait Ambulation/Gait assistance: Mod assist Ambulation Distance (Feet): 10  Feet Assistive device: Rolling walker (2 wheeled) Gait Pattern/deviations: Step-to pattern;Antalgic Gait velocity: decreased Gait velocity interpretation: <1.8 ft/sec, indicative of risk for recurrent falls General Gait Details: verbal and tactile cueing to lift L toes, weightshift onto L and sequencing . slow gait. limited by pain. Mod assist for L foot advancement initially, improved with practice; Mod assist to weight shift onto L leg in stance for R foot advancement. Required seated return.    Stairs            Wheelchair Mobility    Modified Rankin (Stroke Patients Only)       Balance Overall balance assessment: Needs assistance Sitting-balance support: No upper extremity supported;Feet supported Sitting balance-Leahy Scale: Fair     Standing balance support: Bilateral upper extremity supported;During functional activity Standing balance-Leahy Scale: Poor Standing balance comment: Reliant on UE support                            Cognition Arousal/Alertness: Awake/alert(eyes closed 60% of session) Behavior During Therapy: WFL for tasks assessed/performed Overall Cognitive Status: Impaired/Different from baseline Area of Impairment: Following commands;Attention;Problem solving                   Current Attention Level: Alternating   Following Commands: Follows multi-step commands inconsistently;Follows one step commands inconsistently;Follows one step commands with increased time     Problem Solving: Requires verbal cues;Requires tactile cues;Slow processing;Difficulty sequencing        Exercises General Exercises - Lower Extremity Ankle Circles/Pumps: AROM;10 reps Quad Sets: AROM;10 reps Gluteal Sets: AROM;10 reps  Heel Slides: AAROM;10 reps Hip ABduction/ADduction: AAROM;10 reps    General Comments General comments (skin integrity, edema, etc.): Husband present and agreed with recommendation of additional rehab at a SNF.        Pertinent Vitals/Pain Pain Assessment: Faces Faces Pain Scale: Hurts even more Pain Location: L hip Pain Descriptors / Indicators: Grimacing Pain Intervention(s): Limited activity within patient's tolerance;Monitored during session;Premedicated before session    Home Living                      Prior Function            PT Goals (current goals can now be found in the care plan section) Acute Rehab PT Goals Patient Stated Goal: go home with HHPT PT Goal Formulation: With patient/family Time For Goal Achievement: 05/24/17 Potential to Achieve Goals: Good Progress towards PT goals: Not progressing toward goals - comment(Limited by pain and anxiety )    Frequency    Min 5X/week      PT Plan  Discharge plan needs to be updated. Recommend pt discharges to SNF for post-acute rehab.     Co-evaluation              AM-PAC PT "6 Clicks" Daily Activity  Outcome Measure  Difficulty turning over in bed (including adjusting bedclothes, sheets and blankets)?: Unable Difficulty moving from lying on back to sitting on the side of the bed? : Unable Difficulty sitting down on and standing up from a chair with arms (e.g., wheelchair, bedside commode, etc,.)?: Unable Help needed moving to and from a bed to chair (including a wheelchair)?: A Lot Help needed walking in hospital room?: A Lot Help needed climbing 3-5 steps with a railing? : Total 6 Click Score: 8    End of Session Equipment Utilized During Treatment: Gait belt Activity Tolerance: Patient limited by pain;Patient limited by fatigue;Patient tolerated treatment well Patient left: in chair;with family/visitor present;with call bell/phone within reach Nurse Communication: Mobility status;Other (comment)(Discharge plan) PT Visit Diagnosis: Repeated falls (R29.6);History of falling (Z91.81);Unsteadiness on feet (R26.81);Pain;Other abnormalities of gait and mobility (R26.89) Pain - Right/Left: Left Pain - part of  body: Hip     Time: [id number] PT Time Calculation (min) (ACUTE ONLY): 66 min  Charges:  $Gait Training: 23-37 mins $Therapeutic Exercise: 8-22 mins $Therapeutic Activity: 8-22 mins                    G Codes:       Antony Salmonatiana Tavaras Goody, [place]  Office: [phone]   Antony Salmonatiana Cecilie Heidel 05/18/2017, 12:37 PM

## 2017-05-18 NOTE — Clinical Social Work Note (Signed)
CSW confirmed with pt that now PT is recommending SNF, as well as pt wants to go to SNF. Pt [place] go to [facility] [place]. CSW has sent referral.  Velora MediateBridget Rayan Ines, LCSWA [phone]

## 2017-05-18 NOTE — Progress Notes (Signed)
Subjective: 2 Days Post-Op Procedure(s) (LRB): INTRAMEDULLARY (IM) NAIL INTERTROCHANTRIC (Left) Patient reports pain as mild and moderate.    Objective: Vital signs in last 24 hours: Temp:  [98.3 F (36.8 C)-98.8 F (37.1 C)] 98.3 F (36.8 C) (03/31 0520) Pulse Rate:  [87-94] 87 (03/31 0520) Resp:  [18] 18 (03/31 0520) BP: (109-115)/(54-63) 109/54 (03/31 0520) SpO2:  [93 %-96 %] 93 % (03/31 0520)  Intake/Output from previous day: 03/30 0701 - 03/31 0700 In: 367.5 [P.O.:120; I.V.:247.5] Out: -  Intake/Output this shift: No intake/output data recorded.  Recent Labs    05/16/17 0141 05/17/17 0640  HGB 12.2 9.2*   Recent Labs    05/16/17 0141 05/17/17 0640  WBC 12.1* 10.0  RBC 4.46 3.38*  HCT 36.8 28.4*  PLT 234 203   Recent Labs    05/17/17 0640 05/18/17 0440  NA 135 135  K 4.0 3.9  CL 103 102  CO2 25 27  BUN 13 13  CREATININE 0.76 0.71  GLUCOSE 152* 121*  CALCIUM 9.0 8.5*   Recent Labs    05/16/17 0141  INR 1.03    Dorsiflexion/Plantar flexion intact Incision: dressing C/D/I pt standing from chair working with PT  Assessment/Plan: 2 Days Post-Op Procedure(s) (LRB): INTRAMEDULLARY (IM) NAIL INTERTROCHANTRIC (Left) Up with therapy Discharge home with home health  WBAT LLE Pain control as ordered lovenox dvt proph    Margart SicklesJoshua Adonia Porada 05/18/2017, 10:59 AM

## 2017-05-19 ENCOUNTER — Encounter (HOSPITAL_COMMUNITY): Payer: Self-pay | Admitting: Orthopedic Surgery

## 2017-05-19 LAB — BASIC METABOLIC PANEL
Anion gap: 7 (ref 5–15)
BUN: 9 mg/dL (ref 6–20)
CALCIUM: 8.4 mg/dL — AB (ref 8.9–10.3)
CO2: 27 mmol/L (ref 22–32)
CREATININE: 0.59 mg/dL (ref 0.44–1.00)
Chloride: 101 mmol/L (ref 101–111)
GFR calc non Af Amer: >60 mL/min (ref >60)
GLUCOSE: 111 mg/dL — AB (ref 65–99)
Potassium: 3.8 mmol/L (ref 3.5–5.1)
Sodium: 135 mmol/L (ref 135–145)

## 2017-05-19 NOTE — Social Work (Addendum)
CSW confirmed SNF bed with patient at [facility] [place]. CSW contacted SNF and advised of acceptance of SNF bed.  CSW then called [employer] to initiate Insurance Auth.  CSW will continue to follow.  11:29am: Insurance [id number]#[id number] [facility] [place].  CSW will f/u for disposition.  Katie BreathPatricia Latina Frank, LCSW Clinical Social Worker [phone]

## 2017-05-19 NOTE — Progress Notes (Signed)
Physical Therapy Treatment Patient Details Name: Katie Dickerson Ivan Crofturner MRN: [id number] DOB: May 21, 1945 Today's Date: 05/19/2017    History of Present Illness 72 year old female who presented after a fall. She does have a significant past medical history for nonspecific arthritis, A-fib, CHF, anxiety, and neuropathy. She experienced a mechanical fall, no head trauma or loss of consciousness. Sustained left subtrochanteric femur fracture. S/p left IM nail placement on 3/29 and WBAT.    PT Comments    Pt was able to move from supine to standing, walk 12 feet, then stand to sit. Pt demonstrates significant gait deviations due to pain in hip and arthritis in UE that was aggravated by placing more weight through UEs onto walker. Pt moves very slowly and has trouble following commands due to internalization of pain, which makes it harder to attend to commands, confusion/fatigue, and medication. Pt is hard of hearing and is easily distracted. She exhibited significant trouble advancing R foot, requiring a great deal of tactile cueing.   Orthostatics:  Initial lying: 100/53, HR 91 Sitting: 90/53, HR 93 Standing: 102/63, HR 94 Standing at 3 minutes: 152/114, HR 86  Pt exhibited a slight decrease in SBP with sitting but not enough to imply orthostatic hypotension. Standing BP at 3 minutes is innaccurate due to increasing tension in the arms after pt fatigued.   Follow Up Recommendations  SNF     Equipment Recommendations  Rolling walker with 5" wheels    Recommendations for Other Services       Precautions / Restrictions Precautions Precautions: Fall Restrictions Weight Bearing Restrictions: Yes LLE Weight Bearing: Weight bearing as tolerated    Mobility  Bed Mobility Overal bed mobility: Needs Assistance Bed Mobility: Supine to Sit     Supine to sit: Min assist        Transfers Overall transfer level: Needs assistance Equipment used: Rolling walker (2 wheeled) Transfers: Sit  to/from Stand Sit to Stand: Mod assist         General transfer comment: Mod assist to power up. Multiple preparatory steps required to achieve weight over feet to power up.   Ambulation/Gait Ambulation/Gait assistance: Mod assist Ambulation Distance (Feet): 12 Feet Assistive device: Rolling walker (2 wheeled) Gait Pattern/deviations: Step-to pattern Gait velocity: decreased Gait velocity interpretation: <1.8 ft/sec, indicative of risk for recurrent falls General Gait Details: verbal and tactile cueing to lift L toes, weightshift onto L and sequencing . slow gait. limited by pain. Mod assist for L foot advancement initially, improved with practice; Mod assist to weight shift onto L leg in stance for R foot advancement. Tactile cueing to advance R foot. Required seated return.    Stairs            Wheelchair Mobility    Modified Rankin (Stroke Patients Only)       Balance Overall balance assessment: Needs assistance Sitting-balance support: No upper extremity supported Sitting balance-Leahy Scale: Fair     Standing balance support: Bilateral upper extremity supported;During functional activity Standing balance-Leahy Scale: Poor Standing balance comment: Reliant on UE support                            Cognition Arousal/Alertness: Awake/alert Behavior During Therapy: WFL for tasks assessed/performed Overall Cognitive Status: Impaired/Different from baseline Area of Impairment: Attention;Following commands;Safety/judgement;Awareness;Problem solving                   Current Attention Level: Selective   Following Commands: Follows one step  commands inconsistently;Follows one step commands with increased time Safety/Judgement: Decreased awareness of deficits;Decreased awareness of safety Awareness: Emergent Problem Solving: Slow processing;Requires verbal cues;Requires tactile cues;Difficulty sequencing General Comments: Pt with poor ability to  follow commands. Very easily distracted by external and internal stimuli. Suspect cognitive changes related to medications.       Exercises General Exercises - Lower Extremity Ankle Circles/Pumps: AROM;10 reps;Supine;Both Quad Sets: AROM;Left;10 reps;Supine Gluteal Sets: Both;10 reps;Supine;AROM Heel Slides: AAROM;Left;10 reps;Supine Hip ABduction/ADduction: Left;10 reps;Supine;AAROM    General Comments        Pertinent Vitals/Pain Pain Location: L hip Pain Descriptors / Indicators: Grimacing Pain Intervention(s): Limited activity within patient's tolerance    Home Living                      Prior Function            PT Goals (current goals can now be found in the care plan section) Acute Rehab PT Goals Patient Stated Goal: go to rehab before home.  PT Goal Formulation: With patient/family Time For Goal Achievement: 05/24/17 Potential to Achieve Goals: Good Progress towards PT goals: Not progressing toward goals - comment    Frequency    Min 5X/week      PT Plan Current plan remains appropriate    Co-evaluation              AM-PAC PT "6 Clicks" Daily Activity  Outcome Measure  Difficulty turning over in bed (including adjusting bedclothes, sheets and blankets)?: Unable Difficulty moving from lying on back to sitting on the side of the bed? : Unable Difficulty sitting down on and standing up from a chair with arms (e.g., wheelchair, bedside commode, etc,.)?: A Lot Help needed moving to and from a bed to chair (including a wheelchair)?: A Lot Help needed walking in hospital room?: A Lot Help needed climbing 3-5 steps with a railing? : Total 6 Click Score: 9    End of Session Equipment Utilized During Treatment: Gait belt Activity Tolerance: Patient limited by fatigue;Patient limited by pain;Patient tolerated treatment well Patient left: in chair;with call bell/phone within reach   PT Visit Diagnosis: Repeated falls (R29.6);History of falling  (Z91.81);Unsteadiness on feet (R26.81);Pain;Other abnormalities of gait and mobility (R26.89) Pain - Right/Left: Left Pain - part of body: Hip     Time: 1610-9604 PT Time Calculation (min) (ACUTE ONLY): 60 min  Charges:                       G Codes:       Deborha Moseley Garth Bigness, SPT,  Office: [phone]  Chrishun Scheer 05/19/2017, 10:50 AM

## 2017-05-19 NOTE — Social Work (Signed)
CSW contacted SNF and they indicated that they cannot take patient after 3:30pm and would have needed to get medicine.  CSW advised clinical team and patient will discharge to SNF in the morning.  CSW will f/u for disposition.  Keene BreathPatricia Mackey Varricchio, LCSW Clinical Social Worker [phone]

## 2017-05-19 NOTE — Discharge Instructions (Signed)
[facility] Discharge Instructions   General Discharge Instructions  WEIGHT BEARING STATUS: Weightbearing as tolerated Left leg  RANGE OF MOTION/ACTIVITY: activity as tolerated, ROM as tolerated L hip and knee  Ok to ride stationary exercise bike  Daily therapy   Wound Care: daily wound care starting on 05/20/2017. See below   Discharge Wound Care Instructions  Do NOT apply any ointments, solutions or lotions to pin sites or surgical wounds.  These prevent needed drainage and even though solutions like hydrogen peroxide kill bacteria, they also damage cells lining the pin sites that help fight infection.  Applying lotions or ointments can keep the wounds moist and can cause them to breakdown and open up as well. This can increase the risk for infection. When in doubt call the office.  Surgical incisions should be dressed daily.  If any drainage is noted, use one layer of adaptic, then gauze, Kerlix, and an ace wrap.  Once the incision is completely dry and without drainage, it may be left open to air out.  Showering may begin 36-48 hours later.  Cleaning gently with soap and water.  Traumatic wounds should be dressed daily as well.    One layer of adaptic, gauze, Kerlix, then ace wrap.  The adaptic can be discontinued once the draining has ceased    If you have a wet to dry dressing: wet the gauze with saline the squeeze as much saline out so the gauze is moist (not soaking wet), place moistened gauze over wound, then place a dry gauze over the moist one, followed by Kerlix wrap, then ace wrap.   DVT/PE prophylaxis: Lovenox 40 mg sq injection daily x 28 days   Diet: as you were eating previously.  Can use over the counter stool softeners and bowel preparations, such as Miralax, to help with bowel movements.  Narcotics can be constipating.  Be sure to drink plenty of fluids  PAIN MEDICATION USE AND EXPECTATIONS  You have likely been given narcotic medications to help  control your pain.  After a traumatic event that results in an fracture (broken bone) with or without surgery, it is ok to use narcotic pain medications to help control one's pain.  We understand that everyone responds to pain differently and each individual patient will be evaluated on a regular basis for the continued need for narcotic medications. Ideally, narcotic medication use should last no more than 6-8 weeks (coinciding with fracture healing).   As a patient it is your responsibility as well to monitor narcotic medication use and report the amount and frequency you use these medications when you come to your office visit.   We would also advise that if you are using narcotic medications, you should take a dose prior to therapy to maximize you participation.  IF YOU ARE ON NARCOTIC MEDICATIONS IT IS NOT PERMISSIBLE TO OPERATE A MOTOR VEHICLE (MOTORCYCLE/CAR/TRUCK/MOPED) OR HEAVY MACHINERY DO NOT MIX NARCOTICS WITH OTHER CNS (CENTRAL NERVOUS SYSTEM) DEPRESSANTS SUCH AS ALCOHOL   STOP SMOKING OR USING NICOTINE PRODUCTS!!!!  As discussed nicotine severely impairs your body's ability to heal surgical and traumatic wounds but also impairs bone healing.  Wounds and bone heal by forming microscopic blood vessels (angiogenesis) and nicotine is a vasoconstrictor (essentially, shrinks blood vessels).  Therefore, if vasoconstriction occurs to these microscopic blood vessels they essentially disappear and are unable to deliver necessary nutrients to the healing tissue.  This is one modifiable factor that you can do to dramatically increase your chances of  healing your injury.    (This means no smoking, no nicotine gum, patches, etc)  DO NOT USE NONSTEROIDAL ANTI-INFLAMMATORY DRUGS (NSAID'S)  Using products such as Advil (ibuprofen), Aleve (naproxen), Motrin (ibuprofen) for additional pain control during fracture healing can delay and/or prevent the healing response.  If you would like to take over the  counter (OTC) medication, Tylenol (acetaminophen) is ok.  However, some narcotic medications that are given for pain control contain acetaminophen as well. Therefore, you should not exceed more than 4000 mg of tylenol in a day if you do not have liver disease.  Also note that there are may OTC medicines, such as cold medicines and allergy medicines that my contain tylenol as well.  If you have any questions about medications and/or interactions please ask your doctor/PA or your pharmacist.      ICE AND ELEVATE INJURED/OPERATIVE EXTREMITY  Using ice and elevating the injured extremity above your heart can help with swelling and pain control.  Icing in a pulsatile fashion, such as 20 minutes on and 20 minutes off, can be followed.    Do not place ice directly on skin. Make sure there is a barrier between to skin and the ice pack.    Using frozen items such as frozen peas works well as the conform nicely to the are that needs to be iced.  USE AN ACE WRAP OR TED HOSE FOR SWELLING CONTROL  In addition to icing and elevation, Ace wraps or TED hose are used to help limit and resolve swelling.  It is recommended to use Ace wraps or TED hose until you are informed to stop.    When using Ace Wraps start the wrapping distally (farthest away from the body) and wrap proximally (closer to the body)   Example: If you had surgery on your leg or thing and you do not have a splint on, start the ace wrap at the toes and work your way up to the thigh        If you had surgery on your upper extremity and do not have a splint on, start the ace wrap at your fingers and work your way up to the upper arm  IF YOU ARE IN A SPLINT OR CAST DO NOT REMOVE IT FOR ANY REASON   If your splint gets wet for any reason please contact the office immediately. You may shower in your splint or cast as long as you keep it dry.  This can be done by wrapping in a cast cover or garbage back (or similar)  Do Not stick any thing down your splint  or cast such as pencils, money, or hangers to try and scratch yourself with.  If you feel itchy take benadryl as prescribed on the bottle for itching  IF YOU ARE IN A CAM BOOT (BLACK BOOT)  You may remove boot periodically. Perform daily dressing changes as noted below.  Wash the liner of the boot regularly and wear a sock when wearing the boot. It is recommended that you sleep in the boot until told otherwise  CALL THE OFFICE WITH ANY QUESTIONS OR CONCERNS: [phone]       Hip Fracture A hip fracture is a fracture of the upper part of your thigh bone (femur). What are the causes? A hip fracture is caused by a direct blow to the side of your hip. This is usually the result of a fall but can occur in other circumstances, such as an automobile accident. What  increases the risk? There is an increased risk of hip fractures in people with:  An unsteady walking pattern (gait) and those with conditions that contribute to poor balance, such as Parkinson's disease or dementia.  Osteopenia and osteoporosis.  Cancer that spreads to the leg bones.  Certain metabolic diseases.  What are the signs or symptoms? Symptoms of hip fracture include:  Pain over the injured hip.  Inability to put weight on the leg in which the fracture occurred (although, some patients are able to walk after a hip fracture).  Toes and foot of the affected leg point outward when you lie down.  How is this diagnosed? A physical exam can determine if a hip fracture is likely to have occurred. X-ray exams are needed to confirm the fracture and to look for other injuries. The X-ray exam can help to determine the type of hip fracture. Rarely, the fracture is not visible on an X-ray image and a CT scan or MRI will have to be done. How is this treated? The treatment for a fracture is usually surgery. This means using a screw, nail, or rod to hold the bones in place. Follow these instructions at home: Take all medicines  as directed by your health care provider. Contact a health care provider if: Pain continues, even after taking pain medicine. This information is not intended to replace advice given to you by your health care provider. Make sure you discuss any questions you have with your health care provider.

## 2017-05-19 NOTE — Progress Notes (Signed)
Physical Therapy Treatment Patient Details Name: Katie Dickerson MRN: [id number] DOB: [birth year] Today's Date: 05/19/2017    History of Present Illness 72 year old female who presented after a fall. She does have a significant past medical history for nonspecific arthritis, A-fib, CHF, anxiety, and neuropathy. She experienced a mechanical fall, no head trauma or loss of consciousness. Sustained left subtrochanteric femur fracture. S/p left IM nail placement on 3/29 and WBAT.    PT Comments    Required light mod assist to sit up from seat, mod assist supine to sit. Pt unable to walk in-room distances without assistance, limited by pain and anxiety. Pt easily distracted, requiring VC to return to task. Performed long arch quad therapeutic exercise, required verbal and tactile cues to perform exercise and maintain attention. Discharge to SNF to increase independence and safety before discharging home.    Follow Up Recommendations  SNF     Equipment Recommendations  Rolling walker with 5" wheels    Recommendations for Other Services       Precautions / Restrictions Precautions Precautions: Fall Restrictions Weight Bearing Restrictions: Yes LLE Weight Bearing: Weight bearing as tolerated    Mobility  Bed Mobility Overal bed mobility: Needs Assistance Bed Mobility: Supine to Sit  Required verbal and tactile cues for proper hand placement and hip positioning.    Supine to sit: Min assist        Transfers Overall transfer level: Needs assistance Equipment used: Rolling walker (2 wheeled) Transfers: Sit to/from Stand Sit to Stand: Mod assist         General transfer comment: Light mod assist to power up and to sit down with control. VC to shift weight and hand placement.   Ambulation/Gait Ambulation/Gait assistance: Mod assist Ambulation Distance (Feet): 12 Feet Assistive device: Rolling walker (2 wheeled) Gait Pattern/deviations: Step-to pattern;Antalgic Gait  velocity: decreased Gait velocity interpretation: <1.8 ft/sec, indicative of risk for recurrent falls General Gait Details: verbal and tactile cueing to lift L toes, weightshift onto L and sequencing . slow gait. limited by pain. Mod assist for L foot advancement initially, improved with practice; Mod assist to weight shift onto L leg in stance for R foot advancement. Tactile cueing to advance R foot. Required seated return.    Stairs            Wheelchair Mobility    Modified Rankin (Stroke Patients Only)       Balance Overall balance assessment: Needs assistance Sitting-balance support: No upper extremity supported Sitting balance-Leahy Scale: Fair     Standing balance support: Bilateral upper extremity supported;During functional activity Standing balance-Leahy Scale: Poor Standing balance comment: Reliant on UE support                            Cognition Arousal/Alertness: Awake/alert Behavior During Therapy: WFL for tasks assessed/performed Overall Cognitive Status: Impaired/Different from baseline Area of Impairment: Attention;Following commands;Safety/judgement;Awareness;Problem solving                   Current Attention Level: Selective   Following Commands: Follows one step commands consistently;Follows one step commands with increased time Safety/Judgement: Decreased awareness of deficits;Decreased awareness of safety Awareness: Emergent Problem Solving: Slow processing;Requires verbal cues;Requires tactile cues;Difficulty sequencing General Comments: Pt with poor ability to follow commands. Very easily distracted by external and internal stimuli.      Exercises General Exercises - Lower Extremity Long Arc Quad: 10 reps;AAROM;Left    General Comments  Pertinent Vitals/Pain Pain Assessment: Faces Faces Pain Scale: Hurts little more Pain Location: L hip Pain Descriptors / Indicators: Grimacing Pain Intervention(s): Limited  activity within patient's tolerance;Monitored during session;RN gave pain meds during session;Ice applied    Home Living                      Prior Function            PT Goals (current goals can now be found in the care plan section) Acute Rehab PT Goals Patient Stated Goal: go to rehab before home.  PT Goal Formulation: With patient Time For Goal Achievement: 05/24/17 Potential to Achieve Goals: Good Progress towards PT goals: Progressing toward goals(Limited by pain and anxiety, slowly progressing )    Frequency    Min 5X/week      PT Plan Current plan remains appropriate    Co-evaluation              AM-PAC PT "6 Clicks" Daily Activity  Outcome Measure  Difficulty turning over in bed (including adjusting bedclothes, sheets and blankets)?: Unable Difficulty moving from lying on back to sitting on the side of the bed? : A Lot Difficulty sitting down on and standing up from a chair with arms (e.g., wheelchair, bedside commode, etc,.)?: A Lot Help needed moving to and from a bed to chair (including a wheelchair)?: A Lot Help needed walking in hospital room?: A Lot Help needed climbing 3-5 steps with a railing? : Total 6 Click Score: 10    End of Session Equipment Utilized During Treatment: Gait belt Activity Tolerance: Patient limited by fatigue;Patient limited by pain;Patient tolerated treatment well Patient left: in chair;with call bell/phone within reach;with family/visitor present;with nursing/sitter in room   PT Visit Diagnosis: History of falling (Z91.81);Unsteadiness on feet (R26.81);Other abnormalities of gait and mobility (R26.89);Repeated falls (R29.6);Pain Pain - Right/Left: Left Pain - part of body: Hip     Time: [id number] PT Time Calculation (min) (ACUTE ONLY): 33 min  Charges:  $Gait Training: 8-22 mins $Therapeutic Exercise: 8-22 mins                    G Codes:       Katie Dickerson, SPT Acute Rehab Services  Office #:  [phone]   Katie Dickerson 05/19/2017, 4:11 PM

## 2017-05-19 NOTE — Progress Notes (Signed)
Orthopedic Trauma Service Progress Note   Patient ID: Katie Dickerson MRN: [id number] DOB/AGE: [birth year] 74 y.o.  Subjective:  Doing well Some pain as she has just worked with therapy   States she has chronic back pain due to a fracture that was slow to heal about 2 years ago   No other issues  Discussed with PT, she did well overall just slow to mobilize  In talking with the patient it sounds like she believes she is very limited in what she is permitted to do during her recovery.  She is WBAT and has no formal restrictions.  Believe pt will be the one that limits herself the most unless encouraged!!!  ROS As above  Objective:   VITALS:   Vitals:   05/18/17 1453 05/18/17 2100 05/19/17 0438 05/19/17 1500  BP: (!) 104/59 (!) 107/36 (!) 98/56 119/69  Pulse: 91 85 80 88  Resp: 18 18 16 16   Temp: 97.9 F (36.6 C)  98.3 F (36.8 C) 98.3 F (36.8 C)  TempSrc: Oral  Oral Oral  SpO2: 93% (!) 81% 95% 100%  Weight:      Height:        Estimated body mass index is 23.78 kg/m as calculated from the following:   Height as of this encounter: 5\' 2"  (1.575 m).   Weight as of this encounter: 59 kg (130 lb).   Intake/Output      03/31 0701 - 04/01 0700 04/01 0701 - 04/02 0700   P.O. 240 240   I.V. (mL/kg) 92.5 (1.6)    Total Intake(mL/kg) 332.5 (5.6) 240 (4.1)   Net +332.5 +240        Urine Occurrence 3 x      LABS  Results for orders placed or performed during the hospital encounter of 05/16/17 (from the past 24 hour(s))  Basic metabolic panel     Status: Abnormal   Collection Time: 05/19/17  5:59 AM  Result Value Ref Range   Sodium 135 135 - 145 mmol/L   Potassium 3.8 3.5 - 5.1 mmol/L   Chloride 101 101 - 111 mmol/L   CO2 27 22 - 32 mmol/L   Glucose, Bld 111 (H) 65 - 99 mg/dL   BUN 9 6 - 20 mg/dL   Creatinine, Ser 4.09 0.44 - 1.00 mg/dL   Calcium 8.4 (L) 8.9 - 10.3 mg/dL   GFR calc non Af Amer >60 >60 mL/min   GFR calc Af Amer >60 >60  mL/min   Anion gap 7 5 - 15     PHYSICAL EXAM:   Gen: awake and alert, NAD, appears well, sitting in bedside chair  Lungs: breathing unlabored  Cardiac: regular  Ext:       Left Lower Extremity   Dressings c/d/i  Ext warm   + DP pulse  Motor and sensory functions are grossly in tact  No significant swelling distally   Wearing SCDs  No DCT      Assessment/Plan: 3 Days Post-Op   Principal Problem:   Closed left hip fracture, initial encounter [facility]) Active Problems:   Hip fracture (HCC)   Closed left subtrochanteric femur fracture, initial encounter (HCC)   Anti-infectives (From admission, onward)   Start     Dose/Rate Route Frequency Ordered Stop   05/16/17 1015  ceFAZolin (ANCEF) IVPB 2g/100 mL premix     2 g 200 mL/hr over 30 Minutes Intravenous To [facility] 05/16/17 1004 05/16/17 1203   05/16/17 1006  ceFAZolin (ANCEF) 2-4  GM/100ML-% IVPB    Note to Pharmacy:  Lorenda IshiharaGibbs, Bonnie   : cabinet override      05/16/17 1006 05/16/17 1203    .  POD/HD#: 443  72 y/o s/p fall with Left peritrochanteric femur fracture   -fall  - L peritrochanteric femur fracture s/p IMN  WBAT  No ROM restrictions  Lots of encouragement to participate with PT/OT  Dressing changes starting on 05/20/2017. Dry dressing changes daily. Can leave open to air if completely dry    Ok to clean wounds with soap and water    No ointments or lotions to wounds    Ice PRN  - Pain management:  Tylenol  norco   - ABL anemia/Hemodynamics  Stable  - DVT/PE prophylaxis:  Lovenox x 28 days   - Activity:  WBAT L leg  Daily PT/OT  Exercise bike if available, can start ASAP  - FEN/GI prophylaxis/Foley/Lines:  Reg diet  - Dispo:  Dc to SNF today  Follow up with ortho in 10 days   Weightbearing: WBAT LLE Insicional and dressing care: OK to remove dressings 05/20/2017 and leave open to air with dry gauze PRN Orthopedic device(s): walker  Showering: daily  VTE prophylaxis: Lovenox 40mg   qd x 28 days  Pain control: norco  Follow - up plan: 2 weeks Contact information:  Micheal H. Carola FrostHandy MD, Mearl LatinKeith W. Kentrell Guettler PA-C   Mearl LatinKeith W. Particia Strahm, PA-C [facility] [phone](P) [phone] Traci Sermon(O) [phone] (C) 05/19/2017, 4:03 PM

## 2017-05-20 ENCOUNTER — Encounter (HOSPITAL_COMMUNITY): Payer: Self-pay | Admitting: Orthopedic Surgery

## 2017-05-20 DIAGNOSIS — S7222XD Displaced subtrochanteric fracture of left femur, subsequent encounter for closed fracture with routine healing: Secondary | ICD-10-CM | POA: Diagnosis not present

## 2017-05-20 DIAGNOSIS — S72141D Displaced intertrochanteric fracture of right femur, subsequent encounter for closed fracture with routine healing: Secondary | ICD-10-CM | POA: Diagnosis not present

## 2017-05-20 DIAGNOSIS — G8911 Acute pain due to trauma: Secondary | ICD-10-CM | POA: Diagnosis not present

## 2017-05-20 DIAGNOSIS — M810 Age-related osteoporosis without current pathological fracture: Secondary | ICD-10-CM

## 2017-05-20 DIAGNOSIS — R262 Difficulty in walking, not elsewhere classified: Secondary | ICD-10-CM | POA: Diagnosis not present

## 2017-05-20 DIAGNOSIS — W19XXXD Unspecified fall, subsequent encounter: Secondary | ICD-10-CM | POA: Diagnosis not present

## 2017-05-20 DIAGNOSIS — I82409 Acute embolism and thrombosis of unspecified deep veins of unspecified lower extremity: Secondary | ICD-10-CM | POA: Diagnosis not present

## 2017-05-20 DIAGNOSIS — D68318 Other hemorrhagic disorder due to intrinsic circulating anticoagulants, antibodies, or inhibitors: Secondary | ICD-10-CM | POA: Diagnosis not present

## 2017-05-20 DIAGNOSIS — G8918 Other acute postprocedural pain: Secondary | ICD-10-CM | POA: Diagnosis not present

## 2017-05-20 DIAGNOSIS — M546 Pain in thoracic spine: Secondary | ICD-10-CM | POA: Diagnosis not present

## 2017-05-20 DIAGNOSIS — I2699 Other pulmonary embolism without acute cor pulmonale: Secondary | ICD-10-CM | POA: Diagnosis not present

## 2017-05-20 DIAGNOSIS — S79911A Unspecified injury of right hip, initial encounter: Secondary | ICD-10-CM | POA: Diagnosis not present

## 2017-05-20 DIAGNOSIS — S72002D Fracture of unspecified part of neck of left femur, subsequent encounter for closed fracture with routine healing: Secondary | ICD-10-CM | POA: Diagnosis not present

## 2017-05-20 DIAGNOSIS — D649 Anemia, unspecified: Secondary | ICD-10-CM | POA: Diagnosis not present

## 2017-05-20 DIAGNOSIS — F419 Anxiety disorder, unspecified: Secondary | ICD-10-CM | POA: Diagnosis present

## 2017-05-20 HISTORY — DX: Age-related osteoporosis without current pathological fracture: M81.0

## 2017-05-20 LAB — CBC
HCT: 22.3 % — ABNORMAL LOW (ref 36.0–46.0)
Hemoglobin: 7.2 g/dL — ABNORMAL LOW (ref 12.0–15.0)
MCH: 27.3 pg (ref 26.0–34.0)
MCHC: 32.3 g/dL (ref 30.0–36.0)
MCV: 84.5 fL (ref 78.0–100.0)
PLATELETS: 215 10*3/uL (ref 150–400)
RBC: 2.64 MIL/uL — ABNORMAL LOW (ref 3.87–5.11)
RDW: 13.4 % (ref 11.5–15.5)
WBC: 5.7 10*3/uL (ref 4.0–10.5)

## 2017-05-20 MED ORDER — CHOLECALCIFEROL 50 MCG (2000 UT) PO TABS: 2000.0000 [IU] | ORAL_TABLET | Freq: Every day | ORAL | Status: DC

## 2017-05-20 MED ORDER — ACETAMINOPHEN 325 MG PO TABS: 650.0000 mg | ORAL_TABLET | Freq: Three times a day (TID) | ORAL | 0 refills | Status: AC

## 2017-05-20 MED ORDER — VITAMIN D 1000 UNITS PO TABS
2000.0000 [IU] | ORAL_TABLET | Freq: Every day | ORAL | Status: DC
  Administered 2017-05-20: 2000 [IU] via ORAL
  Filled 2017-05-20: qty 2

## 2017-05-20 MED ORDER — HYDROCODONE-ACETAMINOPHEN 5-325 MG PO TABS: 1.0000 | ORAL_TABLET | Freq: Four times a day (QID) | ORAL | 0 refills | Status: DC | PRN

## 2017-05-20 NOTE — Discharge Summary (Signed)
[facility]edic [facility] (OTS)  Patient ID: Katie Dickerson MRN: [id number] DOB/AGE: 03/29/1945 72 y.o.  Admit date: 05/16/2017 Discharge date: 05/20/2017  Admission Diagnoses: Left peritrochanteric hip fracture Anxiety Osteoporosis  Discharge Diagnoses:  Principal Problem:   Closed left hip fracture, initial encounter [facility](HCC) Active Problems:   Hip fracture (HCC)   Closed left subtrochanteric femur fracture, initial encounter (HCC)   Anxiety   Osteoporosis   Past Medical History:  Diagnosis Date  . Anxiety   . Osteoporosis 05/20/2017     Procedures Performed: 05/16/2017-Dr. Carola FrostHandy  INTRAMEDULLARY (IM) NAIL INTERTROCHANTRIC (Left) with Affixus 9 x 360 mm statically locked   Discharged Condition: good  Hospital Course:   72 year old white female admitted on 05/16/2017 after sustaining a fall at home.  Patient was [facility] where she was found to have a left hip fracture.  Patient was cleared for surgery and was taken to the OR on the day of presentation.  Procedure noted above was performed.  After surgery patient was transferred to the PACU for recovery from anesthesia and then transferred to the orthopedic floor for observation, pain control and to begin therapies.  Patient was covered with routine antibiotic coverage phylaxis.  She is also started on Lovenox for DVT and PE prophylaxis on postoperative day #1.  Patient started to work with therapies on postoperative day #1 as well.  Initially patient thought that she would be able to discharge home however she is very slow to mobilize.  Ultimately she decided that she needed a rehab center for further convalescence.  Patient continued to progress somewhat slowly with therapy over the next several days.  At times she felt that they were forcing her beyond what she is capable of doing however she continued to progress well.  On postoperative day #4 patient was deemed stable for discharge to rehab facility, [facility]  [place]. Patient's H&H on 05/20/2017 was 7.2 and 22.3.  She is asymptomatic and no cardiac disease history.  Would recommend rechecking in 2 days.  Would not anticipate any further drop as she is now postoperative day #4  At the time of discharge patient was tolerating a regular diet and voiding without difficulty.  No other issues were noted.   Patient did make mention of her chronic back issues several times during her hospital stay.  On further review of her primary care notes accessible in care everywhere it does appear that she did sustain some type of back fracture either lumbar spine and thoracic spine back in 2016.  Does not appear in any acute interventions were carried out for this other than symptomatic management.  He has had a residual pain and symptoms since then.  It appears that she is tried several medications as well as possible epidural steroid injection which provided very little relief.  I do agree with the assessment that is likely her osteoporosis vitamin D deficiency that her contributing to her persistent pain.  Given her history of what sounds to be an insufficiency he will fracture along with her hip fracture she definitely has osteoporosis be on some type of pharmacologic management however patient has refused pharmacologic treatment of her osteoporosis based off of PCP notes.  Would like to check a vitamin D lab panel before she leaves.  We will follow-up on this.  If this continues to show persistent vitamin D deficiency would adjust her supplementation accordingly.  Would also strongly advocate for a bone density scan however in the setting of to fractures  vertebral fracture and hip fracture falls within the category of treatment.  Will discuss with her office.  Given her acute fracture benefit from either Forteo or Tymlos.  Not sure how agreeable patient would be to administer an injection daily for the next 2 years.  Alternatively we could wait until she achieves union of her  fracture and then start her on oral medication.  Or consider Prolia which is injected every 6 months.  Would not start Prolia or bisphosphonates during her acute fracture healing phase action.  Only agent I would consider starting at this point would be PTH analog  Consults: Internal medicine  Significant Diagnostic Studies: labs:   Results for Katie Dickerson, Katie Dickerson (MRN [id number]) as of 05/20/2017 09:51  Ref. Range 05/20/2017 05:25  WBC Latest Ref Range: 4.0 - 10.5 K/uL 5.7  RBC Latest Ref Range: 3.87 - 5.11 MIL/uL 2.64 (L)  Hemoglobin Latest Ref Range: 12.0 - 15.0 g/dL 7.2 (L)  HCT Latest Ref Range: 36.0 - 46.0 % 22.3 (L)  MCV Latest Ref Range: 78.0 - 100.0 fL 84.5  MCH Latest Ref Range: 26.0 - 34.0 pg 27.3  MCHC Latest Ref Range: 30.0 - 36.0 g/dL 40.9  RDW Latest Ref Range: 11.5 - 15.5 % 13.4  Platelets Latest Ref Range: 150 - 400 K/uL 215    Treatments: IV hydration, antibiotics: Ancef, analgesia: Tylenol, Norco, Dilaudid, anticoagulation: LMW heparin, therapies: PT, OT, RN and SW and surgery: As above  Discharge Exam:  Orthopedic Trauma Service Progress Note    Patient ID: Katie Dickerson MRN: [id number] DOB/AGE: 09-17-1945 72 y.o.   Subjective:   Pt states she had a horrible night Very critical of the care she has received, states the staff was not attentive to her needs    Pt complaining about pain management as well.  On review of the chart over the last several days the pt has only required 2-3 norco daily             Will schedule tylenol and continue PRN norco    Pt indicates that she continues to have a lot of back pain from previous fracture (suspect lumbar or T-spine). Does not appears any formal interventions done to address her fracture acutely. It does sound as if pt has tried numerous things for her pain including oral meds and possible epidural steroid injection    Again pt often goes off on tangents not related to questions that were asked of her She was pleasant  to me this am    Denies any lightheadedness or dizziness  No CP or SOB  No abd pain  + flatus    ROS   Objective:    VITALS:         Vitals:    05/19/17 0438 05/19/17 1500 05/19/17 2033 05/20/17 0658  BP: (!) 98/56 119/69 110/63 117/62  Pulse: 80 88 87 82  Resp: 16 16 16 16   Temp: 98.3 F (36.8 C) 98.3 F (36.8 C) 98.1 F (36.7 C) 98.3 F (36.8 C)  TempSrc: Oral Oral Oral Oral  SpO2: 95% 100% 100% 100%  Weight:          Height:              Estimated body mass index is 23.78 kg/m as calculated from the following:   Height as of this encounter: 5\' 2"  (1.575 m).   Weight as of this encounter: 59 kg (130 lb).     Intake/Output  04/01 0701 - 04/02 0700 04/02 0701 - 04/03 0700   P.O. 240 240   I.V. (mL/kg)     Total Intake(mL/kg) 240 (4.1) 240 (4.1)   Net +240 +240        Urine Occurrence 1 x       LABS   Lab Results Last 24 Hours       Results for orders placed or performed during the hospital encounter of 05/16/17 (from the past 24 hour(s))  CBC     Status: Abnormal    Collection Time: 05/20/17  5:25 AM  Result Value Ref Range    WBC 5.7 4.0 - 10.5 K/uL    RBC 2.64 (L) 3.87 - 5.11 MIL/uL    Hemoglobin 7.2 (L) 12.0 - 15.0 g/dL    HCT 81.1 (L) 91.4 - 46.0 %    MCV 84.5 78.0 - 100.0 fL    MCH 27.3 26.0 - 34.0 pg    MCHC 32.3 30.0 - 36.0 g/dL    RDW 78.2 95.6 - 21.3 %    Platelets 215 150 - 400 K/uL          PHYSICAL EXAM:    Gen: resting comfortably in bed, NAD, appears well Lungs: breathing unlabored  Cardiac: regular Ext:              Dressings c/d/i             Ext warm              + DP pulse             Motor and sensory functions are grossly in tact             No significant swelling distally              Wearing SCDs             No DCT     Assessment/Plan: 4 Days Post-Op    Principal Problem:   Closed left hip fracture, initial encounter [facility]) Active Problems:   Hip fracture (HCC)   Closed left subtrochanteric femur  fracture, initial encounter (HCC)   Anxiety                Anti-infectives (From admission, onward)    Start     Dose/Rate Route Frequency Ordered Stop    05/16/17 1015   ceFAZolin (ANCEF) IVPB 2g/100 mL premix     2 g 200 mL/hr over 30 Minutes Intravenous To [facility] 05/16/17 1004 05/16/17 1203    05/16/17 1006   ceFAZolin (ANCEF) 2-4 GM/100ML-% IVPB    Note to Pharmacy:  Lorenda Ishihara   : cabinet override         05/16/17 1006 05/16/17 1203     .   POD/HD#: 82     72 y/o s/p fall with Left peritrochanteric femur fracture    -fall   - L peritrochanteric femur fracture s/p IMN             WBAT             No ROM restrictions                         Pt can use exercise bike if available at facility              Lots of encouragement to participate with PT/OT  Dressing changes starting today as needed. Dry dressing changes daily as needed. Can leave open to air if completely dry                          Ok to clean wounds with soap and water                          No ointments or lotions to wounds                Ice PRN   - chronic back pain              Based of history it sounds as if this was an osteoporosis induced fracture, just like her hip is              Reviewed some of the PCP notes. Pt has history of osteoporosis and vitamin d deficiency. I do not see recent vitamin d levels available                         Pt has refused pharmacologic intervention for her osteoporosis on several occassions based of PCP notes                           I do think that her osteoporosis and vitamin d deficiency could definitely be contributing to her persistent pain                          Will check vitamin d levels before she discharges                         Will discuss with pt about getting a DEXA done    - Osteoporosis              As above    - Pain management:             Tylenol- will schedule, 650 mg po q8h              norco for breakthrough  pain    - ABL anemia/Hemodynamics             Stable             Recommend checking cbc again in 2 days   - DVT/PE prophylaxis:             Lovenox x 28 days     - Activity:             WBAT L leg             Daily PT/OT             Exercise bike if available, can start ASAP   - FEN/GI prophylaxis/Foley/Lines:             Reg diet   - Dispo:             Dc to SNF today             Follow up with ortho in 10 days        Weightbearing: WBAT LLE Insicional and dressing care: OK to remove dressings as of now and leave open to air. Daily dressing changes with 4x4s and tape if drainage persists  Orthopedic device(s): walker  Showering:  ok to shower and clean surgical site with soap and water only  VTE prophylaxis: Lovenox 40mg  qd 28 days Pain control: scheduled tylenol, norco for breakthrough pain  Follow - up plan: 10 days  Contact information:  Myrene Galas MD, Montez Morita PA-C     Mearl Latin, PA-C [facility] [phone] Traci Sermon (C) 05/20/2017, 9:16 AM   Disposition: Discharge disposition: 03-Skilled Nursing Facility       Discharge Instructions    Call MD / Call 911   Complete by:  As directed    If you experience chest pain or shortness of breath, CALL 911 and be transported to the hospital emergency room.  If you develope a fever above 101 F, pus (white drainage) or increased drainage or redness at the wound, or calf pain, call your surgeon's office.   Constipation Prevention   Complete by:  As directed    Drink plenty of fluids.  Prune juice may be helpful.  You may use a stool softener, such as Colace (over the counter) 100 mg twice a day.  Use MiraLax (over the counter) for constipation as needed.   DME Other see comment   Complete by:  As directed    Shower chair   Diet - low sodium heart healthy   Complete by:  As directed    Diet - low sodium heart healthy   Complete by:  As directed    Diet general    Complete by:  As directed    Discharge instructions   Complete by:  As directed    Please follow with primary care in 7 days.   Discharge instructions   Complete by:  As directed    Please follow up with primary care in 7 days.   Discharge instructions   Complete by:  As directed    Orthopaedic Trauma Service Discharge Instructions   General Discharge Instructions  WEIGHT BEARING STATUS: Weightbearing as tolerated Left leg  RANGE OF MOTION/ACTIVITY: activity as tolerated, ROM as tolerated L hip and knee  Ok to ride stationary exercise bike  Daily therapy   Wound Care: daily wound care starting on 05/20/2017. See below   Discharge Wound Care Instructions  Do NOT apply any ointments, solutions or lotions to pin sites or surgical wounds.  These prevent needed drainage and even though solutions like hydrogen peroxide kill bacteria, they also damage cells lining the pin sites that help fight infection.  Applying lotions or ointments can keep the wounds moist and can cause them to breakdown and open up as well. This can increase the risk for infection. When in doubt call the office.  Surgical incisions should be dressed daily.  If any drainage is noted, use one layer of adaptic, then gauze, Kerlix, and an ace wrap.  Once the incision is completely dry and without drainage, it may be left open to air out.  Showering may begin 36-48 hours later.  Cleaning gently with soap and water.  Traumatic wounds should be dressed daily as well.    One layer of adaptic, gauze, Kerlix, then ace wrap.  The adaptic can be discontinued once the draining has ceased    If you have a wet to dry dressing: wet the gauze with saline the squeeze as much saline out so the gauze is moist (not soaking wet), place moistened gauze over wound, then place a dry gauze over the moist one, followed by Kerlix wrap, then ace wrap.   DVT/PE prophylaxis: Lovenox  40 mg sq injection daily x 28 days   Diet: as you were  eating previously.  Can use over the counter stool softeners and bowel preparations, such as Miralax, to help with bowel movements.  Narcotics can be constipating.  Be sure to drink plenty of fluids  PAIN MEDICATION USE AND EXPECTATIONS  You have likely been given narcotic medications to help control your pain.  After a traumatic event that results in an fracture (broken bone) with or without surgery, it is ok to use narcotic pain medications to help control one's pain.  We understand that everyone responds to pain differently and each individual patient will be evaluated on a regular basis for the continued need for narcotic medications. Ideally, narcotic medication use should last no more than 6-8 weeks (coinciding with fracture healing).   As a patient it is your responsibility as well to monitor narcotic medication use and report the amount and frequency you use these medications when you come to your office visit.   We would also advise that if you are using narcotic medications, you should take a dose prior to therapy to maximize you participation.  IF YOU ARE ON NARCOTIC MEDICATIONS IT IS NOT PERMISSIBLE TO OPERATE A MOTOR VEHICLE (MOTORCYCLE/CAR/TRUCK/MOPED) OR HEAVY MACHINERY DO NOT MIX NARCOTICS WITH OTHER CNS (CENTRAL NERVOUS SYSTEM) DEPRESSANTS SUCH AS ALCOHOL   STOP SMOKING OR USING NICOTINE PRODUCTS!!!!  As discussed nicotine severely impairs your body's ability to heal surgical and traumatic wounds but also impairs bone healing.  Wounds and bone heal by forming microscopic blood vessels (angiogenesis) and nicotine is a vasoconstrictor (essentially, shrinks blood vessels).  Therefore, if vasoconstriction occurs to these microscopic blood vessels they essentially disappear and are unable to deliver necessary nutrients to the healing tissue.  This is one modifiable factor that you can do to dramatically increase your chances of healing your injury.    (This means no smoking, no nicotine gum,  patches, etc)  DO NOT USE NONSTEROIDAL ANTI-INFLAMMATORY DRUGS (NSAID'S)  Using products such as Advil (ibuprofen), Aleve (naproxen), Motrin (ibuprofen) for additional pain control during fracture healing can delay and/or prevent the healing response.  If you would like to take over the counter (OTC) medication, Tylenol (acetaminophen) is ok.  However, some narcotic medications that are given for pain control contain acetaminophen as well. Therefore, you should not exceed more than 4000 mg of tylenol in a day if you do not have liver disease.  Also note that there are may OTC medicines, such as cold medicines and allergy medicines that my contain tylenol as well.  If you have any questions about medications and/or interactions please ask your doctor/PA or your pharmacist.      ICE AND ELEVATE INJURED/OPERATIVE EXTREMITY  Using ice and elevating the injured extremity above your heart can help with swelling and pain control.  Icing in a pulsatile fashion, such as 20 minutes on and 20 minutes off, can be followed.    Do not place ice directly on skin. Make sure there is a barrier between to skin and the ice pack.    Using frozen items such as frozen peas works well as the conform nicely to the are that needs to be iced.  USE AN ACE WRAP OR TED HOSE FOR SWELLING CONTROL  In addition to icing and elevation, Ace wraps or TED hose are used to help limit and resolve swelling.  It is recommended to use Ace wraps or TED hose until you are informed to stop.  When using Ace Wraps start the wrapping distally (farthest away from the body) and wrap proximally (closer to the body)   Example: If you had surgery on your leg or thing and you do not have a splint on, start the ace wrap at the toes and work your way up to the thigh        If you had surgery on your upper extremity and do not have a splint on, start the ace wrap at your fingers and work your way up to the upper arm  IF YOU ARE IN A SPLINT OR CAST DO  NOT REMOVE IT FOR ANY REASON   If your splint gets wet for any reason please contact the office immediately. You may shower in your splint or cast as long as you keep it dry.  This can be done by wrapping in a cast cover or garbage back (or similar)  Do Not stick any thing down your splint or cast such as pencils, money, or hangers to try and scratch yourself with.  If you feel itchy take benadryl as prescribed on the bottle for itching  IF YOU ARE IN A CAM BOOT (BLACK BOOT)  You may remove boot periodically. Perform daily dressing changes as noted below.  Wash the liner of the boot regularly and wear a sock when wearing the boot. It is recommended that you sleep in the boot until told otherwise  CALL THE OFFICE WITH ANY QUESTIONS OR CONCERNS: [phone]   Increase activity slowly   Complete by:  As directed    Increase activity slowly   Complete by:  As directed    Increase activity slowly as tolerated   Complete by:  As directed    Walker rolling   Complete by:  As directed    Weight bearing as tolerated   Complete by:  As directed    Laterality:  left   Extremity:  Lower     Allergies as of 05/20/2017      Reactions   Morphine And Related Anaphylaxis, Shortness Of Breath   "Stopped my breathing"   Other Shortness Of Breath, Itching   Smoke, trees, mold, cleaning products, other environmental triggers      Medication List    STOP taking these medications   acetaminophen 650 MG CR tablet Commonly known as:  TYLENOL Replaced by:  acetaminophen 325 MG tablet   CALCIUM PO   estrogens (conjugated) 0.45 MG tablet Commonly known as:  PREMARIN   traMADol 50 MG tablet Commonly known as:  ULTRAM   UNABLE TO FIND   UNABLE TO FIND     TAKE these medications   acetaminophen 325 MG tablet Commonly known as:  TYLENOL Take 2 tablets (650 mg total) by mouth every 8 (eight) hours. Replaces:  acetaminophen 650 MG CR tablet   ALPRAZolam 1 MG tablet Commonly known as:   XANAX Take 0.5-1 mg by mouth at bedtime as needed for anxiety or sleep.   CENTRUM SILVER 50+WOMEN Tabs Take 1 tablet by mouth daily.   Cholecalciferol 2000 units Tabs Take 1 tablet (2,000 Units total) by mouth daily. What changed:    medication strength  how much to take  Another medication with the same name was removed. Continue taking this medication, and follow the directions you see here.   COQ10 PO Take 1 capsule by mouth daily.   docusate sodium 100 MG capsule Commonly known as:  COLACE Take 1 capsule (100 mg total) by mouth 2 (two) times  daily.   DULoxetine 60 MG capsule Commonly known as:  CYMBALTA Take 60 mg by mouth daily.   enoxaparin 40 MG/0.4ML injection Commonly known as:  LOVENOX Inject 0.4 mLs (40 mg total) into the skin daily.   HYDROcodone-acetaminophen 5-325 MG tablet Commonly known as:  NORCO/VICODIN Take 1 tablet by mouth every 6 (six) hours as needed for moderate pain or severe pain.   ROLAIDS EXTRA STRENGTH 675-135 MG Chew Generic drug:  Ca Carbonate-Mag Hydroxide Chew 1-2 tablets by mouth as needed (for sour stomach).   SYSTANE ULTRA 0.4-0.3 % Soln Generic drug:  Polyethyl Glycol-Propyl Glycol Place 1-2 drops into both eyes daily as needed (for dry eyes).   vitamin C 500 MG tablet Commonly known as:  ASCORBIC ACID Take 500 mg by mouth 2 (two) times a week.   VITAMIN E PO Take 1 capsule by mouth 2 (two) times a week.            Durable Medical Equipment  (From admission, onward)        Start     Ordered   05/18/17 0000  DME Other see comment    Comments:  Shower chair   05/18/17 1044       Discharge Care Instructions  (From admission, onward)        Start     Ordered   05/19/17 0000  Weight bearing as tolerated    Question Answer Comment  Laterality left   Extremity Lower      05/19/17 1623      Contact information for follow-up providers    Myrene Galas, MD. Schedule an appointment as soon as possible for a  visit in 10 day(s).   Specialty:  Orthopedic Surgery Contact information: [address] [place] [address] [phone]        [name]., MD Follow up in 1 week(s).   Specialty:  Internal Medicine Contact information: [address] [place] [address] [phone]            Contact information for after-discharge care    Destination    [facility] .   Service:  Skilled Nursing Contact information: [address] [place] [address] [phone]                  Discharge Instructions and Plan:  72 y/o s/p fall with Left peritrochanteric femur fracture    -fall   - L peritrochanteric femur fracture s/p IMN             WBAT             No ROM restrictions                         Pt can use exercise bike if available at facility              Lots of encouragement to participate with PT/OT             Dressing changes starting today as needed. Dry dressing changes daily as needed. Can leave open to air if completely dry                          Ok to clean wounds with soap and water                          No  ointments or lotions to wounds                Ice PRN   - chronic back pain              Based of history it sounds as if this was an osteoporosis induced fracture, just like her hip is              Reviewed some of the PCP notes. Pt has history of osteoporosis and vitamin d deficiency. I do not see recent vitamin d levels available                         Pt has refused pharmacologic intervention for her osteoporosis on several occassions based of PCP notes                           I do think that her osteoporosis and vitamin d deficiency could definitely be contributing to her persistent pain                          Will check vitamin d levels before she discharges                         Will discuss with pt about getting a DEXA done    - Osteoporosis              As above    - Pain  management:             Tylenol- will schedule, 650 mg po q8h              norco for breakthrough pain    - ABL anemia/Hemodynamics             Stable             Recommend checking cbc again in 2 days   - DVT/PE prophylaxis:             Lovenox x 28 days     - Activity:             WBAT L leg             Daily PT/OT             Exercise bike if available, can start ASAP   - FEN/GI prophylaxis/Foley/Lines:             Reg diet   - Dispo:             Dc to SNF today             Follow up with ortho in 10 days    Signed:  Mearl Latin, PA-C [facility] [phone] (P) 05/20/2017, 9:41 AM

## 2017-05-20 NOTE — Clinical Social Work Placement (Signed)
   CLINICAL SOCIAL WORK PLACEMENT  NOTE  Date:  05/20/2017  Patient Details  Name: Katie Dickerson MRN: [id number] Date of Birth: [birth year]  Clinical Social Work is seeking post-discharge placement for this patient at the Skilled  Nursing Facility level of care (*CSW will initial, date and re-position this form in  chart as items are completed):  Yes   Patient/family provided with [place] Clinical Social Work Department's list of facilities offering this level of care within the geographic area requested by the patient (or if unable, by the patient's family).  Yes   Patient/family informed of their freedom to choose among providers that offer the needed level of care, that participate in Medicare, Medicaid or managed care program needed by the patient, have an available bed and are willing to accept the patient.  Yes   Patient/family informed of [facility]'s ownership interest in [facility] and [facility], as well as of the fact that they are under no obligation to receive care at these facilities.  PASRR submitted to EDS on       PASRR number received on 05/20/17     Existing PASRR number confirmed on       FL2 transmitted to all facilities in geographic area requested by pt/family on 05/18/17     FL2 transmitted to all facilities within larger geographic area on       Patient informed that his/her managed care company has contracts with or will negotiate with certain facilities, including the following:        Yes   Patient/family informed of bed offers received.  Patient chooses bed at [facility] [place]     Physician recommends and patient chooses bed at      Patient to be transferred to [facility], [place] on 05/20/17.  Patient to be transferred to facility by [facility]     Patient family notified on 05/20/17 of transfer.  Name of family member notified:  pt responsible for self     PHYSICIAN       Additional Comment:     _______________________________________________ Tresa MoorePatricia V Jerah Esty, LCSW 05/20/2017, 10:24 AM

## 2017-05-20 NOTE — Progress Notes (Addendum)
Orthopedic Trauma Service Progress Note   Patient ID: Katie Dickerson MRN: [id number] DOB/AGE: Mar 09, 1945 72 y.o.  Subjective:  Pt states she had a horrible night Very critical of the care she has received, states the staff was not attentive to her needs   Pt complaining about pain management as well.  On review of the chart over the last several days the pt has only required 2-3 norco daily  Will schedule tylenol and continue PRN norco   Pt indicates that she continues to have a lot of back pain from previous fracture (suspect lumbar or T-spine). Does not appears any formal interventions done to address her fracture acutely. It does sound as if pt has tried numerous things for her pain including oral meds and possible epidural steroid injection   Again pt often goes off on tangents not related to questions that were asked of her She was pleasant to me this am   Denies any lightheadedness or dizziness  No CP or SOB  No abd pain  + flatus   ROS  Objective:   VITALS:   Vitals:   05/19/17 0438 05/19/17 1500 05/19/17 2033 05/20/17 0658  BP: (!) 98/56 119/69 110/63 117/62  Pulse: 80 88 87 82  Resp: 16 16 16 16   Temp: 98.3 F (36.8 C) 98.3 F (36.8 C) 98.1 F (36.7 C) 98.3 F (36.8 C)  TempSrc: Oral Oral Oral Oral  SpO2: 95% 100% 100% 100%  Weight:      Height:        Estimated body mass index is 23.78 kg/m as calculated from the following:   Height as of this encounter: 5\' 2"  (1.575 m).   Weight as of this encounter: 59 kg (130 lb).   Intake/Output      04/01 0701 - 04/02 0700 04/02 0701 - 04/03 0700   P.O. 240 240   I.V. (mL/kg)     Total Intake(mL/kg) 240 (4.1) 240 (4.1)   Net +240 +240        Urine Occurrence 1 x      LABS  Results for orders placed or performed during the hospital encounter of 05/16/17 (from the past 24 hour(s))  CBC     Status: Abnormal   Collection Time: 05/20/17  5:25 AM  Result Value Ref Range   WBC 5.7  4.0 - 10.5 K/uL   RBC 2.64 (L) 3.87 - 5.11 MIL/uL   Hemoglobin 7.2 (L) 12.0 - 15.0 g/dL   HCT 82.922.3 (L) 56.236.0 - 13.046.0 %   MCV 84.5 78.0 - 100.0 fL   MCH 27.3 26.0 - 34.0 pg   MCHC 32.3 30.0 - 36.0 g/dL   RDW 86.513.4 78.411.5 - 69.615.5 %   Platelets 215 150 - 400 K/uL     PHYSICAL EXAM:   Gen: resting comfortably in bed, NAD, appears well Lungs: breathing unlabored  Cardiac: regular Ext:   Dressings c/d/i             Ext warm              + DP pulse             Motor and sensory functions are grossly in tact             No significant swelling distally              Wearing SCDs             No DCT   Assessment/Plan: 4 Days Post-Op  Principal Problem:   Closed left hip fracture, initial encounter [facility]) Active Problems:   Hip fracture (HCC)   Closed left subtrochanteric femur fracture, initial encounter (HCC)   Anxiety   Anti-infectives (From admission, onward)   Start     Dose/Rate Route Frequency Ordered Stop   05/16/17 1015  ceFAZolin (ANCEF) IVPB 2g/100 mL premix     2 g 200 mL/hr over 30 Minutes Intravenous To [facility] 05/16/17 1004 05/16/17 1203   05/16/17 1006  ceFAZolin (ANCEF) 2-4 GM/100ML-% IVPB    Note to Pharmacy:  Lorenda Ishihara   : cabinet override      05/16/17 1006 05/16/17 1203    .  POD/HD#: 50   72 y/o s/p fall with Left peritrochanteric femur fracture    -fall   - L peritrochanteric femur fracture s/p IMN             WBAT             No ROM restrictions   Pt can use exercise bike if available at facility              Lots of encouragement to participate with PT/OT             Dressing changes starting today as needed. Dry dressing changes daily as needed. Can leave open to air if completely dry                          Ok to clean wounds with soap and water                          No ointments or lotions to wounds                Ice PRN  - chronic back pain   Based of history it sounds as if this was an osteoporosis induced fracture,  just like her hip is   Reviewed some of the PCP notes. Pt has history of osteoporosis and vitamin d deficiency. I do not see recent vitamin d levels available   Pt has refused pharmacologic intervention for her osteoporosis on several occassions based of PCP notes    I do think that her osteoporosis and vitamin d deficiency could definitely be contributing to her persistent pain    Will check vitamin d levels before she discharges   Will discuss with pt about getting a DEXA done    - Osteoporosis   As above   - Pain management:             Tylenol- will schedule, 650 mg po q8h              norco for breakthrough pain    - ABL anemia/Hemodynamics             Stable  Recommend checking cbc again in 2 days   - DVT/PE prophylaxis:             Lovenox x 28 days     - Activity:             WBAT L leg             Daily PT/OT             Exercise bike if available, can start ASAP   - FEN/GI prophylaxis/Foley/Lines:             Reg diet   -  Dispo:             Dc to SNF today             Follow up with ortho in 10 days     Weightbearing: WBAT LLE Insicional and dressing care: OK to remove dressings as of now and leave open to air. Daily dressing changes with 4x4s and tape if drainage persists  Orthopedic device(s): walker  Showering: ok to shower and clean surgical site with soap and water only  VTE prophylaxis: Lovenox 40mg  qd 28 days Pain control: scheduled tylenol, norco for breakthrough pain  Follow - up plan: 10 days  Contact information:  Myrene Galas MD, Montez Morita PA-C   Mearl Latin, PA-C [facility] [phone] Traci Sermon (C) 05/20/2017, 9:16 AM

## 2017-05-20 NOTE — Progress Notes (Signed)
Discharge  Pt's PIV was removed and pt was dressed by husband at the bedside. Pt was given pain medication for transport. Pt reports pain 8/10 at time of transport.   [facility].

## 2017-05-20 NOTE — Social Work (Addendum)
Clinical Social Worker facilitated patient discharge including contacting patient family and facility to confirm patient discharge plans.  Clinical information faxed to facility and family agreeable with plan.    CSW arranged ambulance transport via [facility]    RN to call [phone] to give report prior to discharge. Pt going to Room 712.  Clinical Social Worker will sign off for now as social work intervention is no longer needed. Please consult us again if new need arises.  Keene BreathPatricia Amarya Kuehl, LCSW Clinical Social Worker [phone]

## 2017-05-20 NOTE — Progress Notes (Signed)
Physical Therapy Treatment Patient Details Name: Katie Dickerson MRN: [id number] DOB: 11/18/1945 Today's Date: 05/20/2017    History of Present Illness 72 year old female who presented after a fall. She does have a significant past medical history for nonspecific arthritis, A-fib, CHF, anxiety, and neuropathy. She experienced a mechanical fall, no head trauma or loss of consciousness. Sustained left subtrochanteric femur fracture. S/p left IM nail placement on 3/29 and WBAT.    PT Comments    Pt was much more cognitively present during today's session. Still had some trouble following commands, but seemed to be more attentive today. Pain levels were more bearable during session for patient. Pt reported not sleeping more than an hour last night and that she was tired. Despite this, pt was able to demonstrate improvement in her therex and gait training today. Gait was especially improved, with increased gait velocity, more fluid locomotion, and less pain. Pt progressing at a very pleasing pace since the prior session.    Follow Up Recommendations  SNF     Equipment Recommendations  Rolling walker with 5" wheels    Recommendations for Other Services       Precautions / Restrictions Precautions Precautions: Fall Restrictions Weight Bearing Restrictions: Yes LLE Weight Bearing: Weight bearing as tolerated    Mobility  Bed Mobility Overal bed mobility: Needs Assistance Bed Mobility: Supine to Sit     Supine to sit: Min assist  Comment: Verbal and tactile cues for hand placement and trunk elevation.         Transfers Overall transfer level: Needs assistance Equipment used: Rolling walker (2 wheeled) Transfers: Sit to/from Stand Sit to Stand: Mod assist Stand pivot transfers: Min assist       General transfer comment: Took 2 tries to get up due to feet being too far in front of her, throwing off her COG, and due to initial hesitation to shift her weight anteriorly. Light  mod assist to power up and to sit down with control. VC to shift weight and hand placement.   Ambulation/Gait Ambulation/Gait assistance: Min assist(Pt ambulated much more smoothly, with much less assistance) Ambulation Distance (Feet): 20 Feet Assistive device: Rolling walker (2 wheeled) Gait Pattern/deviations: Step-to pattern;Antalgic;Decreased dorsiflexion - right;Decreased dorsiflexion - left;Decreased stride length Gait velocity: decreased Gait velocity interpretation: <1.8 ft/sec, indicative of risk for recurrent falls General Gait Details: Verbal cues were minimized for gait during this session. Tactile cues and manual facilitation of which leg to advance, pt more mindful of stepping pattern on her own this time. Walked ~20 ft, took 1 sitting break, required seated return. Much improvement noted.   Stairs            Wheelchair Mobility    Modified Rankin (Stroke Patients Only)       Balance Overall balance assessment: Needs assistance Sitting-balance support: No upper extremity supported Sitting balance-Leahy Scale: Fair     Standing balance support: Bilateral upper extremity supported;During functional activity Standing balance-Leahy Scale: Poor Standing balance comment: Reliant on UE support                            Cognition Arousal/Alertness: Awake/alert Behavior During Therapy: WFL for tasks assessed/performed Overall Cognitive Status: Within Functional Limits for tasks assessed Area of Impairment: Safety/judgement;Following commands                   Current Attention Level: Selective   Following Commands: Follows one step commands consistently;Follows one step  commands with increased time Safety/Judgement: Decreased awareness of deficits Awareness: Emergent Problem Solving: Slow processing;Requires verbal cues;Requires tactile cues;Difficulty sequencing General Comments: (Pt showed an improved ability to follow commands today, and  an increased ability to attend to tasks).      Exercises General Exercises - Lower Extremity Ankle Circles/Pumps: AROM;10 reps;Supine;Both Quad Sets: AROM;Both;10 reps;Supine Long Arc Quad: AROM;Strengthening;10 reps;Seated;Left Heel Slides: AAROM;10 reps;Both;Seated Hip Flexion/Marching: AROM;Both;5 reps;Standing(Pt able to weight shift in standing w support of walker + PT)    General Comments General comments (skin integrity, edema, etc.): Pt seemed much more present cognitively this session. Walked notably faster than last session. Pain seemed to limit her less than last time.      Pertinent Vitals/Pain Pain Assessment: 0-10 Pain Score: 7  Faces Pain Scale: Hurts even more Pain Location: L hip Pain Descriptors / Indicators: Grimacing Pain Intervention(s): Monitored during session    Home Living                      Prior Function            PT Goals (current goals can now be found in the care plan section) Acute Rehab PT Goals Patient Stated Goal: go to rehab before home.  PT Goal Formulation: With patient Time For Goal Achievement: 05/24/17 Potential to Achieve Goals: Good Progress towards PT goals: Progressing toward goals    Frequency    Min 5X/week      PT Plan Current plan remains appropriate    Co-evaluation              AM-PAC PT "6 Clicks" Daily Activity  Outcome Measure  Difficulty turning over in bed (including adjusting bedclothes, sheets and blankets)?: A Lot Difficulty moving from lying on back to sitting on the side of the bed? : A Lot Difficulty sitting down on and standing up from a chair with arms (e.g., wheelchair, bedside commode, etc,.)?: A Lot Help needed moving to and from a bed to chair (including a wheelchair)?: A Lot Help needed walking in hospital room?: A Little Help needed climbing 3-5 steps with a railing? : Total 6 Click Score: 12    End of Session Equipment Utilized During Treatment: Gait belt Activity  Tolerance: Patient tolerated treatment well Patient left: in chair;with call bell/phone within reach   PT Visit Diagnosis: History of falling (Z91.81);Unsteadiness on feet (R26.81);Other abnormalities of gait and mobility (R26.89);Repeated falls (R29.6);Pain Pain - Right/Left: Left Pain - part of body: Hip     Time: [id number] PT Time Calculation (min) (ACUTE ONLY): 44 min  Charges:  $Gait Training: 23-37 mins $Therapeutic Activity: 8-22 mins                    G Codes:       Clemon ChambersBrendan Maisie Hauser, SPT Office #: [phone]   Clemon ChambersBrendan Mylin Gignac 05/20/2017, 11:19 AM

## 2017-05-20 NOTE — Progress Notes (Signed)
Patient is being discharged to a skilled nursing facility, currently she does not report any fatigue, lightheadedness, dizziness, shortness of breath or chest pain. Her hemoglobin this morning is 7.2, with hematocrit 22.3, scheduled to recheck hemoglobin as an outpatient. I agreed to hold on blood transfusion unless further drop below 7. Patient does not have any history of heart disease. Follow restrictive transfusion strategy. Will be advisable to check iron stores as part of the workup.

## 2017-05-20 NOTE — [place] FL2 (Signed)
[place] MEDICAID FL2 LEVEL OF CARE SCREENING TOOL     IDENTIFICATION  Patient Name: Katie Dickerson Birthdate: [birth year] Sex: female Admission Date (Current Location): 05/16/2017  [facility] and [place] Number:  Producer, television/film/video and Address:  [facility] [place], [address] [place], [place] [address]      Provider Number: [id number]  Attending Physician Name and Address:  Myrene Galas, MD  Relative Name and Phone Number:  Daniele Dillow, spouse, [phone]    Current Level of Care: Hospital Recommended Level of Care: Skilled Nursing Facility Prior Approval Number:    Date Approved/Denied:   PASRR Number: [id number]  Discharge Plan: SNF    Current Diagnoses: Patient Active Problem List   Diagnosis Date Noted  . Osteoporosis 05/20/2017  . Anxiety   . Closed left hip fracture, initial encounter (HCC) 05/16/2017  . Hip fracture (HCC) 05/16/2017  . Closed left subtrochanteric femur fracture, initial encounter (HCC) 05/16/2017  . Dyspnea 04/01/2015  . Abnormal CT scan, chest 04/01/2015    Orientation RESPIRATION BLADDER Height & Weight     Self, Place, Situation, Time  Normal Continent Weight: 130 lb (59 kg) Height:   (157.5 cm)  BEHAVIORAL SYMPTOMS/MOOD NEUROLOGICAL BOWEL NUTRITION STATUS      Continent Diet(See DC Summary)  AMBULATORY STATUS COMMUNICATION OF NEEDS Skin   Limited Assist Verbally Surgical wounds                       Personal Care Assistance Level of Assistance  Bathing, Feeding, Dressing Bathing Assistance: Limited assistance Feeding assistance: Independent Dressing Assistance: Limited assistance     Functional Limitations Info  Sight, Hearing, Speech Sight Info: Adequate Hearing Info: Adequate Speech Info: Adequate    SPECIAL CARE FACTORS FREQUENCY  PT (By licensed PT), OT (By licensed OT)     PT Frequency: 5x week OT Frequency: 5x week            Contractures      Additional Factors  Info  Code Status, Allergies, Psychotropic Code Status Info: Full  Allergies Info: MORPHINE AND RELATED, OTHER  Psychotropic Info: Cymbalta         Current Medications (05/20/2017):  This is the current hospital active medication list Current Facility-Administered Medications  Medication Dose Route Frequency Provider Last Rate Last Dose  . acetaminophen (TYLENOL) tablet 325-650 mg  325-650 mg Oral Q6H PRN Myrene Galas, MD   650 mg at 05/19/17 1958  . ALPRAZolam Prudy Feeler) tablet 0.5 mg  0.5 mg Oral QHS PRN Arrien, York Ram, MD   0.5 mg at 05/20/17 0010  . bisacodyl (DULCOLAX) EC tablet 5 mg  5 mg Oral Daily PRN Myrene Galas, MD      . cholecalciferol (VITAMIN D) tablet 2,000 Units  2,000 Units Oral Daily Montez Morita, PA-C      . diphenhydrAMINE (BENADRYL) 12.5 MG/5ML elixir 12.5-25 mg  12.5-25 mg Oral Q4H PRN Myrene Galas, MD      . docusate sodium (COLACE) capsule 100 mg  100 mg Oral BID Myrene Galas, MD   100 mg at 05/19/17 1010  . DULoxetine (CYMBALTA) DR capsule 60 mg  60 mg Oral Daily Arrien, York Ram, MD   60 mg at 05/19/17 1010  . enoxaparin (LOVENOX) injection 40 mg  40 mg Subcutaneous Q24H Myrene Galas, MD   40 mg at 05/19/17 0735  . HYDROcodone-acetaminophen (NORCO/VICODIN) 5-325 MG per tablet 1 tablet  1 tablet Oral Q4H PRN Arrien, York Ram,  MD   1 tablet at 05/20/17 0810  . HYDROmorphone (DILAUDID) injection 0.5 mg  0.5 mg Intravenous Q2H PRN Myrene Galas, MD   0.5 mg at 05/19/17 0024  . lactated ringers infusion   Intravenous Continuous Myrene Galas, MD 10 mL/hr at 05/18/17 0533    . methocarbamol (ROBAXIN) tablet 500 mg  500 mg Oral Q6H PRN Myrene Galas, MD   500 mg at 05/20/17 0810   Or  . methocarbamol (ROBAXIN) 500 mg in dextrose 5 % 50 mL IVPB  500 mg Intravenous Q6H PRN Myrene Galas, MD      . metoCLOPramide (REGLAN) tablet 5-10 mg  5-10 mg Oral Q8H PRN Myrene Galas, MD       Or  . metoCLOPramide (REGLAN) injection 5-10 mg  5-10 mg  Intravenous Q8H PRN Myrene Galas, MD      . ondansetron [facility]) tablet 4 mg  4 mg Oral Q6H PRN Myrene Galas, MD       Or  . ondansetron [facility]) injection 4 mg  4 mg Intravenous Q6H PRN Myrene Galas, MD   4 mg at 05/18/17 0726  . pantoprazole (PROTONIX) EC tablet 40 mg  40 mg Oral Daily Myrene Galas, MD   40 mg at 05/19/17 1009  . senna-docusate (Senokot-S) tablet 1 tablet  1 tablet Oral QHS PRN Myrene Galas, MD      . sodium phosphate (FLEET) 7-19 GM/118ML enema 1 enema  1 enema Rectal Once PRN Myrene Galas, MD         Discharge Medications: Please see discharge summary for a list of discharge medications.  Relevant Imaging Results:  Relevant Lab Results:   Additional Information SS#: [id number] [place], LCSW

## 2017-05-20 NOTE — Social Work (Signed)
CSW met with patient at bedside along with spouse to discuss disposition plan. Pt expressed dissatisfaction with hospital stay. CSW validated and provided support.  CSW confirmed SNF bed with facility [facility].  CSW will f/u for disposition.  Elissa Hefty, LCSW Clinical Social Worker [phone]

## 2017-05-28 DIAGNOSIS — D649 Anemia, unspecified: Secondary | ICD-10-CM | POA: Diagnosis not present

## 2017-05-28 DIAGNOSIS — S72141D Displaced intertrochanteric fracture of right femur, subsequent encounter for closed fracture with routine healing: Secondary | ICD-10-CM | POA: Diagnosis not present

## 2017-05-28 DIAGNOSIS — R262 Difficulty in walking, not elsewhere classified: Secondary | ICD-10-CM | POA: Diagnosis not present

## 2017-05-28 DIAGNOSIS — G8918 Other acute postprocedural pain: Secondary | ICD-10-CM | POA: Diagnosis not present

## 2017-05-28 DIAGNOSIS — S72002D Fracture of unspecified part of neck of left femur, subsequent encounter for closed fracture with routine healing: Secondary | ICD-10-CM | POA: Diagnosis not present

## 2017-06-02 DIAGNOSIS — S7222XD Displaced subtrochanteric fracture of left femur, subsequent encounter for closed fracture with routine healing: Secondary | ICD-10-CM | POA: Diagnosis not present

## 2017-06-04 ENCOUNTER — Other Ambulatory Visit: Payer: Self-pay

## 2017-06-04 NOTE — Patient Outreach (Signed)
[facility]  06/04/2017  Ivan CroftShirley W Herandez 29-Jan-1946 [id number]  Subjective: "no, I don't need anything".  Referral received on 06/03/17 [employer]: discharged from [facility] on 06/02/17.   Client reports some equipment was delivered to the room that she did not need. Client reports she is going to call regarding the equipment. Client states she will be calling [facility] soon stating she has received a call from them asking about how she is doing.  RNCM called and spoke with client. She reports she has all the medications she was discharged home on. She has follow up appointments scheduled and denies any needs at this time.  RNCM discussed Care management program. Client denies any needs at this time.  RNCM will not open up case.  Kathyrn SheriffJuana Rodrigues Urbanek, RN, MSN, [facility],CCM [facility] Community Care Coordinator Cell: [phone]

## 2017-07-02 DIAGNOSIS — S7222XD Displaced subtrochanteric fracture of left femur, subsequent encounter for closed fracture with routine healing: Secondary | ICD-10-CM | POA: Diagnosis not present

## 2017-07-21 DIAGNOSIS — K210 Gastro-esophageal reflux disease with esophagitis: Secondary | ICD-10-CM | POA: Diagnosis not present

## 2017-07-21 DIAGNOSIS — E0590 Thyrotoxicosis, unspecified without thyrotoxic crisis or storm: Secondary | ICD-10-CM | POA: Diagnosis not present

## 2017-07-21 DIAGNOSIS — R5383 Other fatigue: Secondary | ICD-10-CM | POA: Diagnosis not present

## 2017-07-21 DIAGNOSIS — E782 Mixed hyperlipidemia: Secondary | ICD-10-CM | POA: Diagnosis not present

## 2017-07-21 DIAGNOSIS — M810 Age-related osteoporosis without current pathological fracture: Secondary | ICD-10-CM | POA: Diagnosis not present

## 2017-07-21 DIAGNOSIS — R5381 Other malaise: Secondary | ICD-10-CM | POA: Diagnosis not present

## 2017-07-21 DIAGNOSIS — M150 Primary generalized (osteo)arthritis: Secondary | ICD-10-CM | POA: Diagnosis not present

## 2017-08-13 DIAGNOSIS — S7222XD Displaced subtrochanteric fracture of left femur, subsequent encounter for closed fracture with routine healing: Secondary | ICD-10-CM | POA: Diagnosis not present

## 2017-09-24 DIAGNOSIS — S7222XD Displaced subtrochanteric fracture of left femur, subsequent encounter for closed fracture with routine healing: Secondary | ICD-10-CM | POA: Diagnosis not present

## 2017-10-29 DIAGNOSIS — M25552 Pain in left hip: Secondary | ICD-10-CM | POA: Diagnosis not present

## 2017-10-29 DIAGNOSIS — S7292XA Unspecified fracture of left femur, initial encounter for closed fracture: Secondary | ICD-10-CM | POA: Diagnosis not present

## 2017-10-30 DIAGNOSIS — Z23 Encounter for immunization: Secondary | ICD-10-CM | POA: Diagnosis not present

## 2017-12-13 DIAGNOSIS — N390 Urinary tract infection, site not specified: Secondary | ICD-10-CM | POA: Diagnosis not present

## 2017-12-16 DIAGNOSIS — N390 Urinary tract infection, site not specified: Secondary | ICD-10-CM | POA: Diagnosis not present

## 2017-12-16 DIAGNOSIS — T50905A Adverse effect of unspecified drugs, medicaments and biological substances, initial encounter: Secondary | ICD-10-CM | POA: Diagnosis not present

## 2017-12-23 DIAGNOSIS — R309 Painful micturition, unspecified: Secondary | ICD-10-CM | POA: Diagnosis not present

## 2018-01-06 DIAGNOSIS — K591 Functional diarrhea: Secondary | ICD-10-CM | POA: Diagnosis not present

## 2018-01-29 DIAGNOSIS — F419 Anxiety disorder, unspecified: Secondary | ICD-10-CM | POA: Diagnosis not present

## 2018-01-29 DIAGNOSIS — M810 Age-related osteoporosis without current pathological fracture: Secondary | ICD-10-CM | POA: Diagnosis not present

## 2018-01-29 DIAGNOSIS — F331 Major depressive disorder, recurrent, moderate: Secondary | ICD-10-CM | POA: Diagnosis not present

## 2018-01-29 DIAGNOSIS — L900 Lichen sclerosus et atrophicus: Secondary | ICD-10-CM | POA: Diagnosis not present

## 2018-01-29 DIAGNOSIS — M150 Primary generalized (osteo)arthritis: Secondary | ICD-10-CM | POA: Diagnosis not present

## 2018-01-29 DIAGNOSIS — R5381 Other malaise: Secondary | ICD-10-CM | POA: Diagnosis not present

## 2018-01-29 DIAGNOSIS — N6019 Diffuse cystic mastopathy of unspecified breast: Secondary | ICD-10-CM | POA: Diagnosis not present

## 2018-01-29 DIAGNOSIS — F5101 Primary insomnia: Secondary | ICD-10-CM | POA: Diagnosis not present

## 2018-01-29 DIAGNOSIS — K210 Gastro-esophageal reflux disease with esophagitis: Secondary | ICD-10-CM | POA: Diagnosis not present

## 2018-01-29 DIAGNOSIS — Z0000 Encounter for general adult medical examination without abnormal findings: Secondary | ICD-10-CM | POA: Diagnosis not present

## 2018-01-29 DIAGNOSIS — Z79899 Other long term (current) drug therapy: Secondary | ICD-10-CM | POA: Diagnosis not present

## 2018-01-29 DIAGNOSIS — Z23 Encounter for immunization: Secondary | ICD-10-CM | POA: Diagnosis not present

## 2018-01-29 DIAGNOSIS — E559 Vitamin D deficiency, unspecified: Secondary | ICD-10-CM | POA: Diagnosis not present

## 2018-01-29 DIAGNOSIS — R5383 Other fatigue: Secondary | ICD-10-CM | POA: Diagnosis not present

## 2018-01-29 DIAGNOSIS — E0590 Thyrotoxicosis, unspecified without thyrotoxic crisis or storm: Secondary | ICD-10-CM | POA: Diagnosis not present

## 2018-04-11 IMAGING — DX DG FEMUR 2+V PORT*L*
5 series · 5 of 5 positions shown · non-contrast
Comparison: Intraoperative radiographs from the same day.

CLINICAL DATA: Closed left subtrochanteric femur fracture, initial
encounter.

EXAM:
LEFT FEMUR PORTABLE 2 VIEWS

[femur ap (1 of 3)]
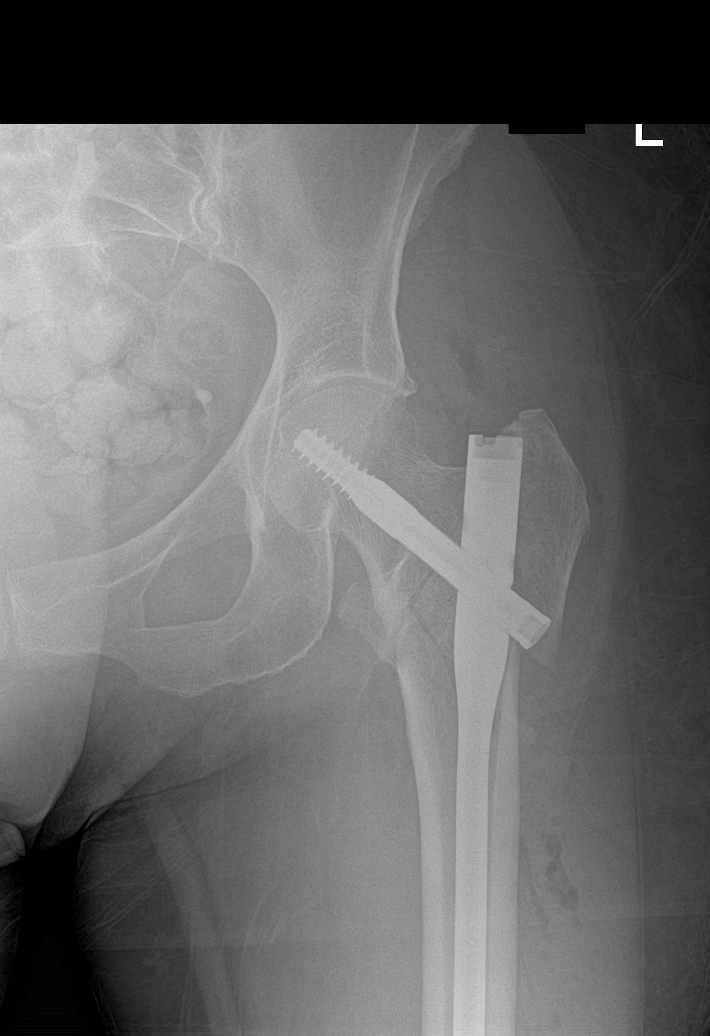

[femur ap (2 of 3)]
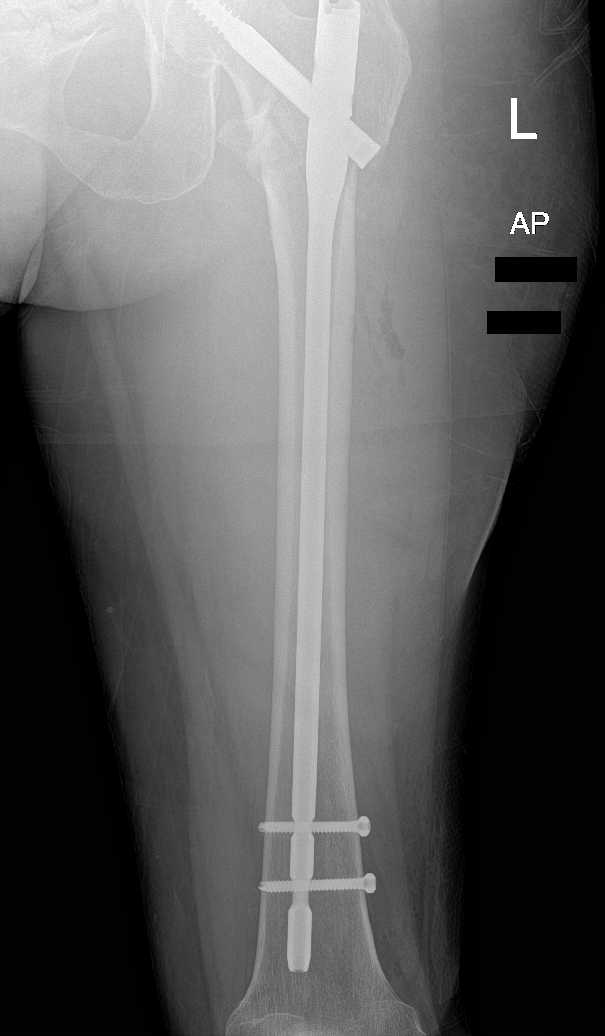

[femur lat (1 of 2)]
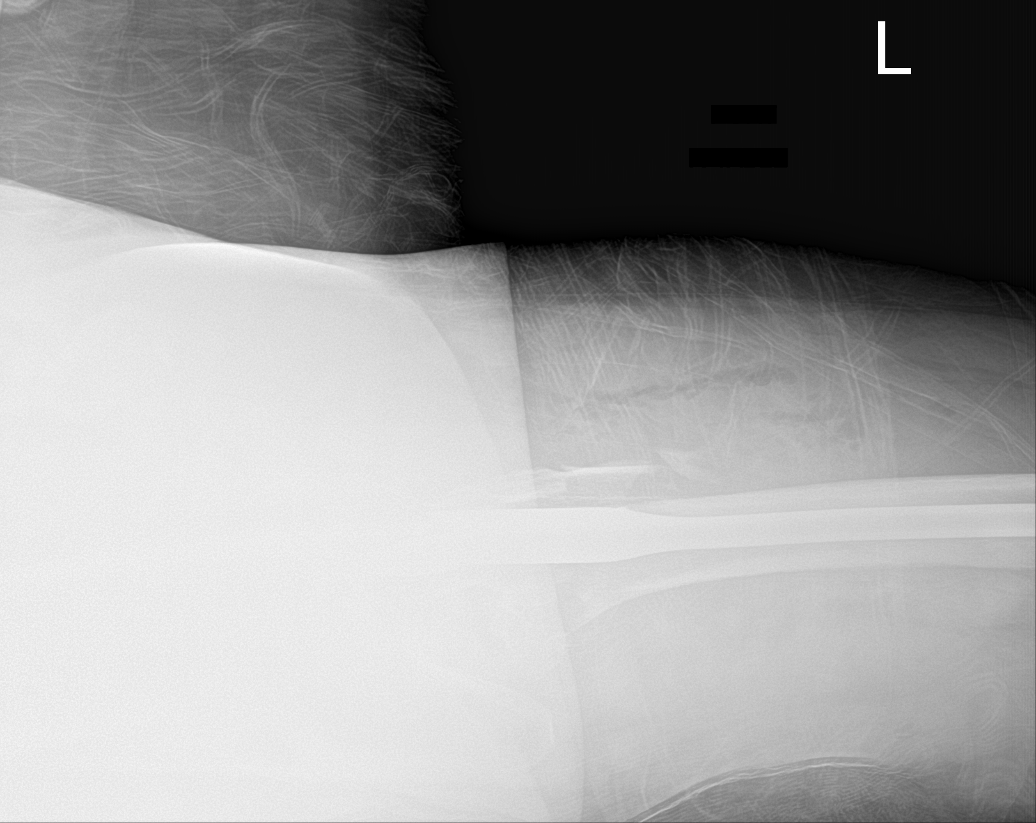

[femur lat (2 of 2)]
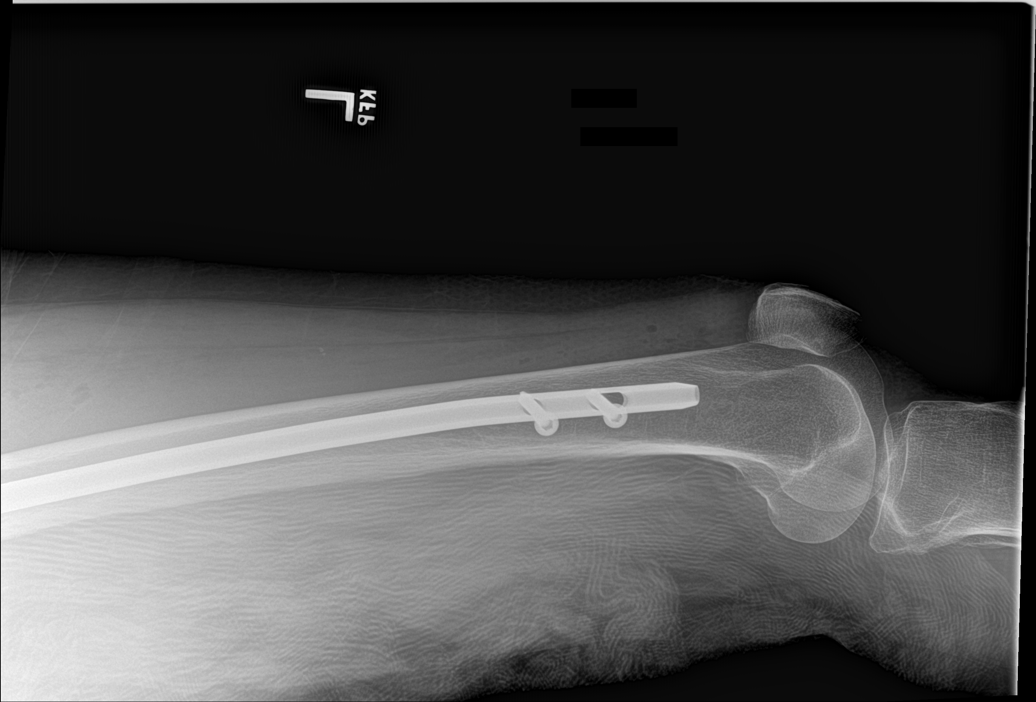

[femur ap (3 of 3)]
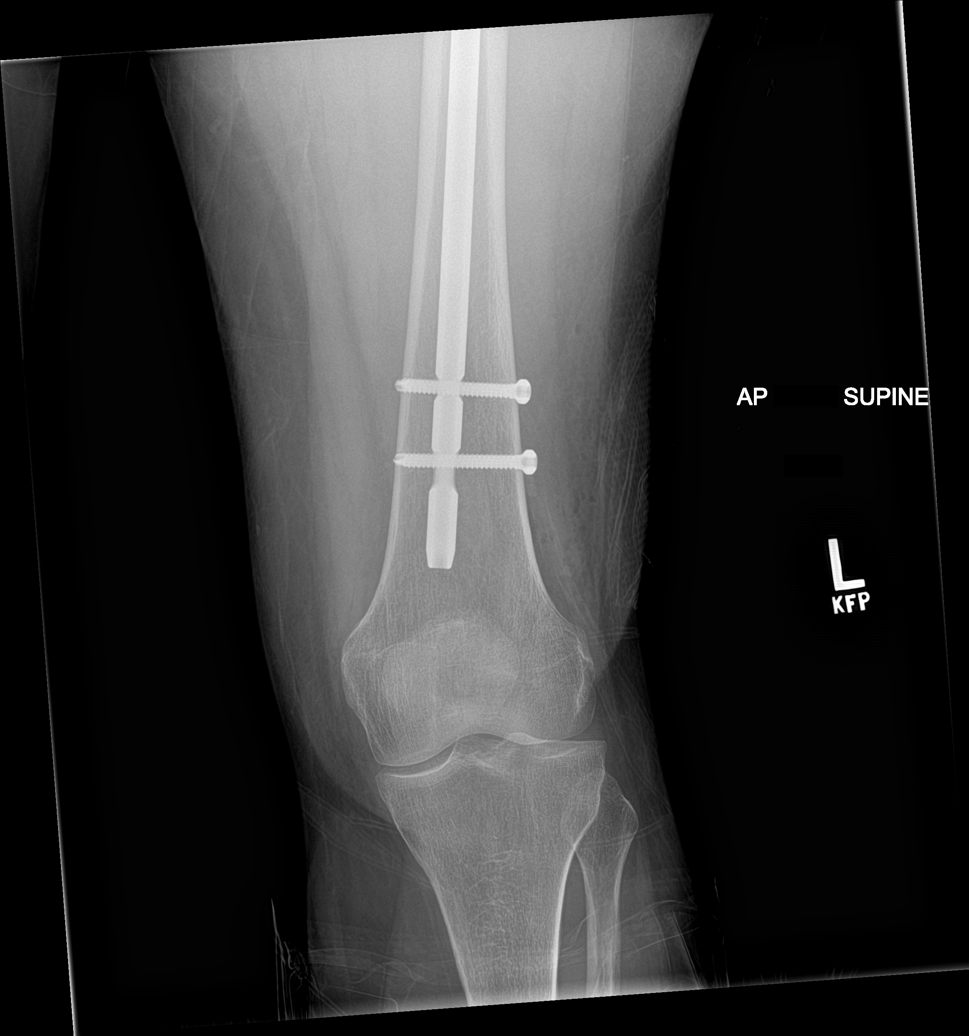

[5 of 5 positions shown; findings below may reference images not displayed]

FINDINGS: The comminuted intertrochanteric fracture has been reduced. With
intramedullary rod is in place with 2 distal interlocking screws.
The proximal dynamic screw traverses the femoral neck.

Pelvis is intact.  Fragments are noted anterior on the lateral view.
IMPRESSION: ORIF of comminuted intertrochanteric fracture of the left hip
without radiographic evidence for complication.

## 2018-07-28 DIAGNOSIS — Z79899 Other long term (current) drug therapy: Secondary | ICD-10-CM | POA: Diagnosis not present

## 2018-07-28 DIAGNOSIS — F419 Anxiety disorder, unspecified: Secondary | ICD-10-CM | POA: Diagnosis not present

## 2018-07-28 DIAGNOSIS — K210 Gastro-esophageal reflux disease with esophagitis: Secondary | ICD-10-CM | POA: Diagnosis not present

## 2018-07-28 DIAGNOSIS — Z8639 Personal history of other endocrine, nutritional and metabolic disease: Secondary | ICD-10-CM | POA: Diagnosis not present

## 2018-07-28 DIAGNOSIS — F331 Major depressive disorder, recurrent, moderate: Secondary | ICD-10-CM | POA: Diagnosis not present

## 2018-07-28 DIAGNOSIS — Z5181 Encounter for therapeutic drug level monitoring: Secondary | ICD-10-CM | POA: Diagnosis not present

## 2018-07-28 DIAGNOSIS — F5101 Primary insomnia: Secondary | ICD-10-CM | POA: Diagnosis not present

## 2018-07-28 DIAGNOSIS — R5383 Other fatigue: Secondary | ICD-10-CM | POA: Diagnosis not present

## 2018-07-28 DIAGNOSIS — M8949 Other hypertrophic osteoarthropathy, multiple sites: Secondary | ICD-10-CM | POA: Diagnosis not present

## 2018-07-28 DIAGNOSIS — R5381 Other malaise: Secondary | ICD-10-CM | POA: Diagnosis not present

## 2018-12-08 DIAGNOSIS — R5383 Other fatigue: Secondary | ICD-10-CM | POA: Diagnosis not present

## 2018-12-08 DIAGNOSIS — R5381 Other malaise: Secondary | ICD-10-CM | POA: Diagnosis not present

## 2018-12-08 DIAGNOSIS — E782 Mixed hyperlipidemia: Secondary | ICD-10-CM | POA: Diagnosis not present

## 2018-12-08 DIAGNOSIS — F419 Anxiety disorder, unspecified: Secondary | ICD-10-CM | POA: Diagnosis not present

## 2018-12-08 DIAGNOSIS — M8949 Other hypertrophic osteoarthropathy, multiple sites: Secondary | ICD-10-CM | POA: Diagnosis not present

## 2018-12-08 DIAGNOSIS — Z8639 Personal history of other endocrine, nutritional and metabolic disease: Secondary | ICD-10-CM | POA: Diagnosis not present

## 2018-12-08 DIAGNOSIS — F5101 Primary insomnia: Secondary | ICD-10-CM | POA: Diagnosis not present

## 2018-12-08 DIAGNOSIS — Z23 Encounter for immunization: Secondary | ICD-10-CM | POA: Diagnosis not present

## 2018-12-08 DIAGNOSIS — E559 Vitamin D deficiency, unspecified: Secondary | ICD-10-CM | POA: Diagnosis not present

## 2020-03-29 ENCOUNTER — Encounter: Payer: Self-pay | Admitting: Gastroenterology

## 2020-04-19 ENCOUNTER — Ambulatory Visit: Payer: PPO | Admitting: Gastroenterology

## 2020-04-28 HISTORY — PX: ESOPHAGOGASTRODUODENOSCOPY: SHX1529

## 2020-04-28 HISTORY — PX: COLONOSCOPY: SHX174

## 2020-08-07 ENCOUNTER — Ambulatory Visit (INDEPENDENT_AMBULATORY_CARE_PROVIDER_SITE_OTHER): Payer: Medicare Other | Admitting: Gastroenterology

## 2020-08-07 ENCOUNTER — Encounter: Payer: Self-pay | Admitting: Gastroenterology

## 2020-08-07 ENCOUNTER — Other Ambulatory Visit: Payer: Self-pay

## 2020-08-07 VITALS — BP 142/88 | HR 70 | Ht 62.0 in | Wt 132.4 lb

## 2020-08-07 DIAGNOSIS — K449 Diaphragmatic hernia without obstruction or gangrene: Secondary | ICD-10-CM

## 2020-08-07 DIAGNOSIS — K219 Gastro-esophageal reflux disease without esophagitis: Secondary | ICD-10-CM

## 2020-08-07 DIAGNOSIS — D509 Iron deficiency anemia, unspecified: Secondary | ICD-10-CM

## 2020-08-07 MED ORDER — PANTOPRAZOLE SODIUM 40 MG PO TBEC: 40.0000 mg | DELAYED_RELEASE_TABLET | Freq: Every day | ORAL | 11 refills | Status: AC

## 2020-08-07 NOTE — Progress Notes (Signed)
Chief Complaint: IDA  Referring Provider:  [name]., MD      ASSESSMENT AND PLAN;   #1. IDA with heme neg stools. Neg recent EGD/colon.  Records awaited.  #2. GERD with mod/large HH on CT. Possible that she may have Cameron erosions.   Plan: -Protonix 40mg  po qd #30, 11 refills -Records regarding last recent EGD/colon -CT reviewed -Hematology consult at [facility] (per pt's request) -VCE only if needed. No indication at this time as she is heme-negative. -FU thereafter -Continue p.o. iron supplements for now.   HPI:    Katie Dickerson is a 75 y.o. female  Who lives in [place] now  With IDA since March 2019 as below.  Most recently on 06/12/2020 Hb was 8.5 with MCV 64. Nl BUN/Cr.  Iron studies consistent with IDA (iron<10, ferritin 3, TIBC 470) she has been started on p.o. iron supplements.  She underwent CT Abdo/pelvis at [facility] Jul 05, 2020 which was negative for any etiology except for mod-large [facility]   S/P EGD/colon (1 polyp) @ [facility], [place] 3-4 weeks ago.  We do not have those records.  I have reviewed care everywhere as well.  Per patient, these were negative.  Had heme-negative stools today  No nausea, vomiting, heartburn, regurgitation, odynophagia or dysphagia.  No significant diarrhea or constipation.  No melena or hematochezia. No unintentional weight loss. No abdominal pain.  No nonsteroidals.  No history of craving of ice.  Note that she also was diagnosed as having B12 deficiency and is currently taking B12 shots.   Previous GI work-up: CT [facility] May 18,2022 [facility] 1. No acute finding. No specific cause for pain. 2. Multiple right renal calculi with patchy cortical scarring 3. Moderate to large sliding hiatal hernia. 4. Aortic Atherosclerosis (ICD10-I70.0).  EGD/colonoscopy recently as above.  S/P ENT eval for possible thrush.  S/P hysterectomy.  EGD 11/2008: neg with neg SB Bx  Colon 11/2008:  neg Past Medical History:  Diagnosis Date  . Anxiety   . Anxiety   . Fibrocystic disease of breast   . GERD (gastroesophageal reflux disease)   . Hypercholesterolemia   . Hypothyroidism   . IBS (irritable bowel syndrome)    predominantly constipation  . Osteoarthritis   . Osteoporosis 05/20/2017    Past Surgical History:  Procedure Laterality Date  . ANTERIOR AND POSTERIOR REPAIR  04/04/2011   Procedure: ANTERIOR (CYSTOCELE) AND POSTERIOR REPAIR (RECTOCELE);  Surgeon: 04/06/2011, MD;  Location: [facility];  Service: Gynecology;  Laterality: N/A;  . COLONOSCOPY  11/25/2008   Mild sigmoid diverticulosis. Small internal hemorrhoids. Otherwise normal colonoscopy to terminal ileum.  . ESOPHAGOGASTRODUODENOSCOPY  11/25/2008   Normal EGD  . INTRAMEDULLARY (IM) NAIL INTERTROCHANTERIC Left 05/16/2017   Procedure: INTRAMEDULLARY (IM) NAIL INTERTROCHANTRIC;  Surgeon: 05/18/2017, MD;  Location: [facility] OR;  Service: Orthopedics;  Laterality: Left;  . LESION EXCISION  53yrs ago  . NECK SURGERY    . PARTIAL HYSTERECTOMY  60yrs ago    Family History  Problem Relation Age of Onset  . Congestive Heart Failure Mother   . Atrial fibrillation Sister   . CAD Neg Hx     Social History   Tobacco Use  . Smoking status: Never  . Smokeless tobacco: Never  Substance Use Topics  . Alcohol use: Yes    Alcohol/week: 1.0 standard drink    Types: 1 Glasses of wine per week    Comment: occasionally  . Drug use: No  Current Outpatient Medications  Medication Sig Dispense Refill  . acetaminophen (TYLENOL) 325 MG tablet Take 2 tablets (650 mg total) by mouth every 8 (eight) hours. 90 tablet 0  . ALPRAZolam (XANAX) 1 MG tablet Take 0.5-1 mg by mouth at bedtime as needed for anxiety or sleep.     . Coenzyme Q10 (COQ10 PO) Take 1 capsule by mouth daily.     . DULoxetine (CYMBALTA) 60 MG capsule Take 60 mg by mouth daily.    . Multiple Vitamins-Minerals (CENTRUM SILVER 50+WOMEN) TABS Take 1 tablet by mouth  daily.    . traMADol (ULTRAM) 50 MG tablet Take 50 mg by mouth 2 (two) times daily as needed.     No current facility-administered medications for this visit.    Allergies  Allergen Reactions  . Morphine And Related Anaphylaxis and Shortness Of Breath    "Stopped my breathing"  . Other Shortness Of Breath and Itching    Smoke, trees, mold, cleaning products, other environmental triggers  . Sulfamethoxazole-Trimethoprim Rash    Review of Systems:  Constitutional: Denies fever, chills, diaphoresis, appetite change and fatigue.  HEENT: Denies photophobia, eye pain, redness, hearing loss, ear pain, congestion, sore throat, rhinorrhea, sneezing, mouth sores, neck pain, neck stiffness and tinnitus.   Respiratory: Denies SOB, DOE, cough, chest tightness,  and wheezing.   Cardiovascular: Denies chest pain, palpitations and leg swelling.  Genitourinary: Denies dysuria, urgency, frequency, hematuria, flank pain and difficulty urinating.  Musculoskeletal: Denies myalgias, back pain, joint swelling, arthralgias and gait problem.  Skin: No rash.  Neurological: Denies dizziness, seizures, syncope, weakness, light-headedness, numbness and headaches.  Hematological: Denies adenopathy. Easy bruising, personal or family bleeding history  Psychiatric/Behavioral: Has anxiety or depression     Physical Exam:    There were no vitals taken for this visit. Wt Readings from Last 3 Encounters:  05/16/17 130 lb (59 kg)  03/31/15 149 lb (67.6 kg)  04/05/11 145 lb (65.8 kg)   Constitutional:  Well-developed, in no acute distress. Psychiatric: Normal mood and affect. Behavior is normal. HEENT: Conjunctivae are normal. No scleral icterus. Neck supple.  Cardiovascular: Normal rate, regular rhythm. No edema Pulmonary/chest: Effort normal and breath sounds normal. No wheezing, rales or rhonchi. Abdominal: Soft, nondistended. Nontender. Bowel sounds active throughout. There are no masses palpable. No  hepatomegaly. Rectal: Performed in presence of Brooke, brown heme-negative stools.  No obvious rectal lesions. Neurological: Alert and oriented to person place and time. Skin: Skin is warm and dry. No rashes noted.  Data Reviewed: I have personally reviewed following labs and imaging studies  CBC: CBC Latest Ref Rng & Units 05/20/2017 05/17/2017 05/16/2017  WBC 4.0 - 10.5 K/uL 5.7 10.0 12.1(H)  Hemoglobin 12.0 - 15.0 g/dL 7.2(L) 9.2(L) 12.2  Hematocrit 36.0 - 46.0 % 22.3(L) 28.4(L) 36.8  Platelets 150 - 400 K/uL 215 203 234    CMP: CMP Latest Ref Rng & Units 05/19/2017 05/18/2017 05/17/2017  Glucose 65 - 99 mg/dL 144(R) 154(M) 086(P)  BUN 6 - 20 mg/dL 9 13 13   Creatinine 0.44 - 1.00 mg/dL 6.19 5.09  Sodium 135 - 145 mmol/L 135 135 135  Potassium 3.5 - 5.1 mmol/L 3.8 3.9 4.0  Chloride 101 - 111 mmol/L 101 102 103  CO2 22 - 32 mmol/L 27 27 25   Calcium 8.9 - 10.3 mg/dL 3.26) ) 9.0  Total Protein 6.0 - 8.3 g/dL - - -  Total Bilirubin 0.3 - 1.2 mg/dL - - -  Alkaline Phos 39 - 117 U/L - - -  AST 0 - 37 U/L - - -  ALT 0 - 35 U/L - - -     Edman Circle, MD 08/07/2020, 3:00 PM  Cc: [name]., MD

## 2020-08-07 NOTE — Patient Instructions (Addendum)
If you are age 77 or older, your body mass index should be between 23-30. Your Body mass index is 24.21 kg/m. If this is out of the aforementioned range listed, please consider follow up with your Primary Care Provider.  If you are age 44 or younger, your body mass index should be between 19-25. Your Body mass index is 24.21 kg/m. If this is out of the aformentioned range listed, please consider follow up with your Primary Care Provider.   __________________________________________________________  The [facility] providers would like to encourage you to use [facility] to communicate with providers for non-urgent requests or questions.  Due to long hold times on the telephone, sending your provider a message by [facility] may be a faster and more efficient way to get a response.  Please allow 48 business hours for a response.  Please remember that this is for non-urgent requests.   Please call regarding hematology referral in 1 week.  We have sent the following medications to your pharmacy for you to pick up at your convenience: Protonix  Thank you,  Dr. Lynann Bologna

## 2020-08-08 ENCOUNTER — Telehealth: Payer: Self-pay

## 2020-08-08 NOTE — Telephone Encounter (Signed)
LVM  Hematology referral I was told by a office ([phone]) in [place] usually refer their patients to [place]. Trying to see about one there but patient can call her insurance company as well regarding who she can see that is closer

## 2020-08-08 NOTE — Telephone Encounter (Signed)
Referral went to [facility] fax number [phone] attn Clydie Braun  LVM on home and the cell number said that the mailbox hasn't been set up

## 2020-08-08 NOTE — Telephone Encounter (Signed)
Looks like one I found was at [facility] in [facility] [address], [place], [place] [address] [phone] Fax [phone] (need to check it) Left message for them to call me back.

## 2020-08-08 NOTE — Telephone Encounter (Signed)
Patient was informed of message below.

## 2020-08-10 NOTE — Telephone Encounter (Signed)
Inbound call from patient stating she received a call from [place] with [facility] informing her they need additional information but not sure what they are needing.  Clydie Braun can be reached at [phone].

## 2020-08-11 NOTE — Telephone Encounter (Signed)
LVM for Katie Dickerson at number provided

## 2020-08-11 NOTE — Telephone Encounter (Signed)
Spoke to Clydie Braun and she has everything she needs for now but will call back if she needs anything else

## 2021-09-01 ENCOUNTER — Other Ambulatory Visit: Payer: Self-pay | Admitting: Gastroenterology
